# Patient Record
Sex: Male | Born: 1955 | Race: White | Hispanic: No | Marital: Married | Smoking: Former smoker
Health system: Southern US, Community
[De-identification: ages and names within clinical notes are randomized; demographics above are authoritative.]

## PROBLEM LIST (undated history)

## (undated) DIAGNOSIS — K449 Diaphragmatic hernia without obstruction or gangrene: Secondary | ICD-10-CM

## (undated) DIAGNOSIS — I10 Essential (primary) hypertension: Secondary | ICD-10-CM

## (undated) DIAGNOSIS — J40 Bronchitis, not specified as acute or chronic: Secondary | ICD-10-CM

## (undated) DIAGNOSIS — T7840XA Allergy, unspecified, initial encounter: Secondary | ICD-10-CM

## (undated) DIAGNOSIS — J329 Chronic sinusitis, unspecified: Secondary | ICD-10-CM

## (undated) DIAGNOSIS — K5792 Diverticulitis of intestine, part unspecified, without perforation or abscess without bleeding: Secondary | ICD-10-CM

## (undated) DIAGNOSIS — J45909 Unspecified asthma, uncomplicated: Secondary | ICD-10-CM

## (undated) DIAGNOSIS — F419 Anxiety disorder, unspecified: Secondary | ICD-10-CM

## (undated) DIAGNOSIS — K649 Unspecified hemorrhoids: Secondary | ICD-10-CM

## (undated) DIAGNOSIS — K219 Gastro-esophageal reflux disease without esophagitis: Secondary | ICD-10-CM

## (undated) HISTORY — DX: Unspecified asthma, uncomplicated: J45.909

## (undated) HISTORY — DX: Allergy, unspecified, initial encounter: T78.40XA

## (undated) HISTORY — PX: INGUINAL HERNIA REPAIR: SUR1180

## (undated) HISTORY — DX: Unspecified hemorrhoids: K64.9

## (undated) HISTORY — DX: Diaphragmatic hernia without obstruction or gangrene: K44.9

## (undated) HISTORY — DX: Chronic sinusitis, unspecified: J32.9

---

## 2003-06-10 ENCOUNTER — Ambulatory Visit (HOSPITAL_COMMUNITY): Admission: RE | Admit: 2003-06-10 | Discharge: 2003-06-10 | Payer: Self-pay | Admitting: Family Medicine

## 2007-10-10 ENCOUNTER — Emergency Department (HOSPITAL_COMMUNITY): Admission: EM | Admit: 2007-10-10 | Discharge: 2007-10-10 | Payer: Self-pay | Admitting: Emergency Medicine

## 2012-02-21 DIAGNOSIS — R0602 Shortness of breath: Secondary | ICD-10-CM

## 2015-08-01 ENCOUNTER — Emergency Department (HOSPITAL_COMMUNITY): Payer: Managed Care, Other (non HMO)

## 2015-08-01 ENCOUNTER — Encounter (HOSPITAL_COMMUNITY): Payer: Self-pay | Admitting: Emergency Medicine

## 2015-08-01 ENCOUNTER — Emergency Department (HOSPITAL_COMMUNITY)
Admission: EM | Admit: 2015-08-01 | Discharge: 2015-08-01 | Disposition: A | Discharge status: Home / Self Care | Payer: Managed Care, Other (non HMO) | Attending: Emergency Medicine | Admitting: Emergency Medicine

## 2015-08-01 DIAGNOSIS — Z8719 Personal history of other diseases of the digestive system: Secondary | ICD-10-CM | POA: Insufficient documentation

## 2015-08-01 DIAGNOSIS — F419 Anxiety disorder, unspecified: Secondary | ICD-10-CM | POA: Insufficient documentation

## 2015-08-01 DIAGNOSIS — R1010 Upper abdominal pain, unspecified: Secondary | ICD-10-CM | POA: Diagnosis present

## 2015-08-01 DIAGNOSIS — R1013 Epigastric pain: Secondary | ICD-10-CM | POA: Diagnosis not present

## 2015-08-01 DIAGNOSIS — Z87891 Personal history of nicotine dependence: Secondary | ICD-10-CM | POA: Diagnosis not present

## 2015-08-01 HISTORY — DX: Anxiety disorder, unspecified: F41.9

## 2015-08-01 HISTORY — DX: Gastro-esophageal reflux disease without esophagitis: K21.9

## 2015-08-01 LAB — COMPREHENSIVE METABOLIC PANEL
ALT: 36 U/L (ref 17–63)
ANION GAP: 14 (ref 5–15)
AST: 26 U/L (ref 15–41)
Albumin: 4.6 g/dL (ref 3.5–5.0)
Alkaline Phosphatase: 66 U/L (ref 38–126)
BILIRUBIN TOTAL: 1.2 mg/dL (ref 0.3–1.2)
BUN: 10 mg/dL (ref 6–20)
CO2: 27 mmol/L (ref 22–32)
Calcium: 11.4 mg/dL — ABNORMAL HIGH (ref 8.9–10.3)
Chloride: 101 mmol/L (ref 101–111)
Creatinine, Ser: 1.2 mg/dL (ref 0.61–1.24)
Glucose, Bld: 109 mg/dL — ABNORMAL HIGH (ref 65–99)
POTASSIUM: 4 mmol/L (ref 3.5–5.1)
Sodium: 142 mmol/L (ref 135–145)
TOTAL PROTEIN: 7.5 g/dL (ref 6.5–8.1)

## 2015-08-01 LAB — CBC
HEMATOCRIT: 47.2 % (ref 39.0–52.0)
Hemoglobin: 16.5 g/dL (ref 13.0–17.0)
MCH: 31.5 pg (ref 26.0–34.0)
MCHC: 35 g/dL (ref 30.0–36.0)
MCV: 90.2 fL (ref 78.0–100.0)
Platelets: 266 10*3/uL (ref 150–400)
RBC: 5.23 MIL/uL (ref 4.22–5.81)
RDW: 12.2 % (ref 11.5–15.5)
WBC: 7.4 10*3/uL (ref 4.0–10.5)

## 2015-08-01 LAB — D-DIMER, QUANTITATIVE (NOT AT ARMC)

## 2015-08-01 LAB — LIPASE, BLOOD: Lipase: 21 U/L (ref 11–51)

## 2015-08-01 LAB — I-STAT TROPONIN, ED: Troponin i, poc: 0 ng/mL (ref 0.00–0.08)

## 2015-08-01 MED ORDER — GI COCKTAIL ~~LOC~~
30.0000 mL | Freq: Once | ORAL | Status: AC
  Administered 2015-08-01: 30 mL via ORAL
  Filled 2015-08-01: qty 30

## 2015-08-01 MED ORDER — HYDROXYZINE HCL 25 MG PO TABS: 25.0000 mg | ORAL_TABLET | Freq: Four times a day (QID) | ORAL | Status: DC | PRN

## 2015-08-01 NOTE — ED Notes (Signed)
Pt sts abd pain and pressure in upper area x 2 days; pt sts some SOB with pain; pt sts some diaphoresis

## 2015-08-01 NOTE — Discharge Instructions (Signed)
Please continue taking your protonix 30 minutes before each meal but no more than twice daily to help with your discomfort.  Take hydroxyzine as needed for anxiety.  Follow up with your doctor for further management of your condition.  You may also benefit seeing a cardiologist for evaluation of your heart such as having a stress test if your personal doctor deem appropriate.  Return to ER if you have any concerns.   Gastroesophageal Reflux Disease, Adult Normally, food travels down the esophagus and stays in the stomach to be digested. However, when a person has gastroesophageal reflux disease (GERD), food and stomach acid move back up into the esophagus. When this happens, the esophagus becomes sore and inflamed. Over time, GERD can create small holes (ulcers) in the lining of the esophagus.  CAUSES This condition is caused by a problem with the muscle between the esophagus and the stomach (lower esophageal sphincter, or LES). Normally, the LES muscle closes after food passes through the esophagus to the stomach. When the LES is weakened or abnormal, it does not close properly, and that allows food and stomach acid to go back up into the esophagus. The LES can be weakened by certain dietary substances, medicines, and medical conditions, including:  Tobacco use.  Pregnancy.  Having a hiatal hernia.  Heavy alcohol use.  Certain foods and beverages, such as coffee, chocolate, onions, and peppermint. RISK FACTORS This condition is more likely to develop in:  People who have an increased body weight.  People who have connective tissue disorders.  People who use NSAID medicines. SYMPTOMS Symptoms of this condition include:  Heartburn.  Difficult or painful swallowing.  The feeling of having a lump in the throat.  Abitter taste in the mouth.  Bad breath.  Having a large amount of saliva.  Having an upset or bloated stomach.  Belching.  Chest pain.  Shortness of breath or  wheezing.  Ongoing (chronic) cough or a night-time cough.  Wearing away of tooth enamel.  Weight loss. Different conditions can cause chest pain. Make sure to see your health care provider if you experience chest pain. DIAGNOSIS Your health care provider will take a medical history and perform a physical exam. To determine if you have mild or severe GERD, your health care provider may also monitor how you respond to treatment. You may also have other tests, including:  An endoscopy toexamine your stomach and esophagus with a small camera.  A test thatmeasures the acidity level in your esophagus.  A test thatmeasures how much pressure is on your esophagus.  A barium swallow or modified barium swallow to show the shape, size, and functioning of your esophagus. TREATMENT The goal of treatment is to help relieve your symptoms and to prevent complications. Treatment for this condition may vary depending on how severe your symptoms are. Your health care provider may recommend:  Changes to your diet.  Medicine.  Surgery. HOME CARE INSTRUCTIONS Diet  Follow a diet as recommended by your health care provider. This may involve avoiding foods and drinks such as:  Coffee and tea (with or without caffeine).  Drinks that containalcohol.  Energy drinks and sports drinks.  Carbonated drinks or sodas.  Chocolate and cocoa.  Peppermint and mint flavorings.  Garlic and onions.  Horseradish.  Spicy and acidic foods, including peppers, chili powder, curry powder, vinegar, hot sauces, and barbecue sauce.  Citrus fruit juices and citrus fruits, such as oranges, lemons, and limes.  Tomato-based foods, such as red sauce,  chili, salsa, and pizza with red sauce.  Fried and fatty foods, such as donuts, french fries, potato chips, and high-fat dressings.  High-fat meats, such as hot dogs and fatty cuts of red and white meats, such as rib eye steak, sausage, ham, and bacon.  High-fat  dairy items, such as whole milk, butter, and cream cheese.  Eat small, frequent meals instead of large meals.  Avoid drinking large amounts of liquid with your meals.  Avoid eating meals during the 2-3 hours before bedtime.  Avoid lying down right after you eat.  Do not exercise right after you eat. General Instructions  Pay attention to any changes in your symptoms.  Take over-the-counter and prescription medicines only as told by your health care provider. Do not take aspirin, ibuprofen, or other NSAIDs unless your health care provider told you to do so.  Do not use any tobacco products, including cigarettes, chewing tobacco, and e-cigarettes. If you need help quitting, ask your health care provider.  Wear loose-fitting clothing. Do not wear anything tight around your waist that causes pressure on your abdomen.  Raise (elevate) the head of your bed 6 inches (15cm).  Try to reduce your stress, such as with yoga or meditation. If you need help reducing stress, ask your health care provider.  If you are overweight, reduce your weight to an amount that is healthy for you. Ask your health care provider for guidance about a safe weight loss goal.  Keep all follow-up visits as told by your health care provider. This is important. SEEK MEDICAL CARE IF:  You have new symptoms.  You have unexplained weight loss.  You have difficulty swallowing, or it hurts to swallow.  You have wheezing or a persistent cough.  Your symptoms do not improve with treatment.  You have a hoarse voice. SEEK IMMEDIATE MEDICAL CARE IF:  You have pain in your arms, neck, jaw, teeth, or back.  You feel sweaty, dizzy, or light-headed.  You have chest pain or shortness of breath.  You vomit and your vomit looks like blood or coffee grounds.  You faint.  Your stool is bloody or black.  You cannot swallow, drink, or eat.   This information is not intended to replace advice given to you by your  health care provider. Make sure you discuss any questions you have with your health care provider.   Document Released: 02/24/2005 Document Revised: 02/05/2015 Document Reviewed: 09/11/2014 Elsevier Interactive Patient Education [employer].

## 2015-08-01 NOTE — ED Provider Notes (Signed)
CSN: [id number]     Arrival date & time 08/01/15  0707 History   First MD Initiated Contact with Patient 08/01/15 [phone]     Chief Complaint  Patient presents with  . Abdominal Pain     (Consider location/radiation/quality/duration/timing/severity/associated sxs/prior Treatment) HPI   60 year old male with history of GERD and anxiety who presents for evaluation of upper abdominal pain. Patient reports yesterday afternoon he developed gradual onset of upper abdominal pain which she described as a gnawing sensation, persistent, with associate lightheadedness, dizziness, shortness of breath and feeling anxious. He mentioned that the symptoms felt similar to his usual GERD but a little bit more intense. He also admits to having history of anxiety and this has causes anxiety 2 intensified. He woke up this morning with a sense sensation decided to come to ER for further evaluation. At this time he reports this discomfort is 5 out of 10 without any specific treatment. He usually takes Protonix daily for his GERD. He is a former smoker and has quit for more than a year. He denies any prior history of cardiac disease. Denies any prior history of PE or DVT, no recent surgery, prolonged bed rest, unilateral leg swelling, calf pain, active cancer, or hemoptysis. 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 Furthermore he denies any URI symptoms. His primary concern is actually his anxiety and states that he was on anti-anxiety medication but weaning himself off more than a year ago. He denies any history of alcohol abuse, no history of diabetes, or hypertension.  Past Medical History  Diagnosis Date  . GERD (gastroesophageal reflux disease)   . Anxiety    History reviewed. No pertinent past surgical history. History reviewed. No pertinent family history. Social History  Substance Use Topics  . Smoking status: Former Research scientist (life sciences)  . Smokeless tobacco: None  .  Alcohol Use: Yes     Comment: occ    Review of Systems  All other systems reviewed and are negative.     Allergies  Review of patient's allergies indicates no known allergies.  Home Medications   Prior to Admission medications   Not on File   BP 159/100 mmHg  Pulse 74  Temp(Src) 97.6 F (36.4 C) (Oral)  Resp 20  SpO2 100% Physical Exam  Constitutional: He appears well-developed and well-nourished. No distress.  Caucasian male pacing around the room but in no acute discomfort.  HENT:  Head: Atraumatic.  Eyes: Conjunctivae are normal.  Neck: Neck supple.  Cardiovascular: Normal rate and regular rhythm.   Pulmonary/Chest: Effort normal and breath sounds normal.  Abdominal: Soft. There is tenderness (Mild epigastric tenderness without guarding or rebound tenderness. Negative Murphy sign, no pain at McBurney's point.).  Musculoskeletal: He exhibits no edema.  Neurological: He is alert.  Skin: No rash noted.  Psychiatric: He has a normal mood and affect.  Nursing note and vitals reviewed.   ED Course  Procedures (including critical care time) Labs Review Labs Reviewed  COMPREHENSIVE METABOLIC PANEL - Abnormal; Notable for the following:    Glucose, Bld 109 (*)    Calcium 11.4 (*)    All other components within normal limits  LIPASE, BLOOD  CBC  D-DIMER, QUANTITATIVE (NOT AT [facility])  Randolm Idol, ED    Imaging Review Dg Chest 2 View  08/01/2015  CLINICAL DATA:  Epigastric pain, shortness of breath and cough. History of bronchitis, gastroesophageal reflux, discontinued smoking 6 months ago. EXAM: CHEST  2 VIEW COMPARISON:  Portable chest x-ray of September 15, 2014 FINDINGS: The lungs are adequately inflated. There is no focal infiltrate. The interstitial markings are coarse bilaterally. The heart and pulmonary vascularity are normal. The mediastinum is normal in width. There is no pleural effusion. The bony thorax exhibits no acute abnormality. IMPRESSION: Chronic  bronchitic and smoking related changes. There is no evidence of pneumonia, CHF, nor other acute cardiopulmonary disease. Electronically Signed   By: David  Martinique M.D.   On: 08/01/2015 08:49   I have personally reviewed and evaluated these images and lab results as part of my medical decision-making.   EKG Interpretation   Date/Time:  Friday August 01 2015 07:23:33 EST Ventricular Rate:  79 PR Interval:  160 QRS Duration: 106 QT Interval:  372 QTC Calculation: 426 R Axis:   -74 Text Interpretation:  Normal sinus rhythm Left axis deviation Pulmonary  disease pattern Incomplete right bundle branch block Abnormal ECG  Confirmed by Hazle Coca [phone]) on 08/01/2015 7:49:02 AM      MDM   Final diagnoses:  Epigastric abdominal pain  Anxiety  History of gastroesophageal reflux (GERD)    BP 150/97 mmHg  Pulse 73  Temp(Src) 97.6 F (36.4 C) (Oral)  Resp 16  SpO2 98%   8:19 AM Patient presents with upper abdominal pain likely related to GERD a possible hiatal hernia given his presentation. He is at low risk for ACS however given his age, a cardiac workup was obtained. I have low suspicion for biliary disease or pancreatitis. Although patient is low risk for PE, he expressed that his grandfather died from a blood clot in the past and was concerned. He did report having shortness of breath and due to his age, I cannot use PERC criteria to rule him out, therefore a d-dimer was obtained.  9:14 AM EKGs is without acute ischemic changes. Chest x-ray demonstrated chronic bronchitic changes but no signs of pneumonia, CHF or other acute cardiopulmonary disease. Has normal lipase and normal electrolytes panel with normal H&H.  9:57 AM Patient report moderate improvement after receiving GI cocktail. His d-dimer was negative therefore no suspicion for pulmonary embolism. I recommend patient to follow-up with his primary care provider for further evaluation and recommend outpatient cardiac evaluation  if deemed appropriate by his PCP. Return precautions discussed. Care discussed with Dr. Ralene Bathe.  Domenic Moras, PA-C 08/01/15 [place], MD 08/01/15 1041

## 2016-08-05 ENCOUNTER — Encounter (HOSPITAL_COMMUNITY): Payer: Self-pay | Admitting: Cardiology

## 2016-08-05 ENCOUNTER — Emergency Department (HOSPITAL_COMMUNITY)
Admission: EM | Admit: 2016-08-05 | Discharge: 2016-08-05 | Disposition: A | Discharge status: Home / Self Care | Payer: Managed Care, Other (non HMO) | Attending: Emergency Medicine | Admitting: Emergency Medicine

## 2016-08-05 ENCOUNTER — Emergency Department (HOSPITAL_COMMUNITY): Payer: Managed Care, Other (non HMO)

## 2016-08-05 DIAGNOSIS — I160 Hypertensive urgency: Secondary | ICD-10-CM | POA: Diagnosis not present

## 2016-08-05 DIAGNOSIS — R0789 Other chest pain: Secondary | ICD-10-CM | POA: Diagnosis present

## 2016-08-05 DIAGNOSIS — M79671 Pain in right foot: Secondary | ICD-10-CM | POA: Insufficient documentation

## 2016-08-05 DIAGNOSIS — Z87891 Personal history of nicotine dependence: Secondary | ICD-10-CM | POA: Insufficient documentation

## 2016-08-05 DIAGNOSIS — Z79899 Other long term (current) drug therapy: Secondary | ICD-10-CM | POA: Diagnosis not present

## 2016-08-05 LAB — BASIC METABOLIC PANEL
Anion gap: 10 (ref 5–15)
BUN: 13 mg/dL (ref 6–20)
CHLORIDE: 99 mmol/L — AB (ref 101–111)
CO2: 27 mmol/L (ref 22–32)
CREATININE: 1.15 mg/dL (ref 0.61–1.24)
Calcium: 10.1 mg/dL (ref 8.9–10.3)
GFR calc Af Amer: >60 mL/min (ref >60)
GFR calc non Af Amer: >60 mL/min (ref >60)
GLUCOSE: 115 mg/dL — AB (ref 65–99)
POTASSIUM: 4 mmol/L (ref 3.5–5.1)
SODIUM: 136 mmol/L (ref 135–145)

## 2016-08-05 LAB — URINALYSIS, ROUTINE W REFLEX MICROSCOPIC
BILIRUBIN URINE: NEGATIVE
GLUCOSE, UA: NEGATIVE mg/dL
HGB URINE DIPSTICK: NEGATIVE
KETONES UR: NEGATIVE mg/dL
Leukocytes, UA: NEGATIVE
Nitrite: NEGATIVE
PROTEIN: NEGATIVE mg/dL
Specific Gravity, Urine: 1.001 — ABNORMAL LOW (ref 1.005–1.030)
pH: 8 (ref 5.0–8.0)

## 2016-08-05 LAB — CBC WITH DIFFERENTIAL/PLATELET
Basophils Absolute: 0 10*3/uL (ref 0.0–0.1)
Basophils Relative: 0 %
EOS ABS: 0.1 10*3/uL (ref 0.0–0.7)
EOS PCT: 1 %
HCT: 46.5 % (ref 39.0–52.0)
Hemoglobin: 16.1 g/dL (ref 13.0–17.0)
LYMPHS ABS: 1.6 10*3/uL (ref 0.7–4.0)
LYMPHS PCT: 16 %
MCH: 31.9 pg (ref 26.0–34.0)
MCHC: 34.6 g/dL (ref 30.0–36.0)
MCV: 92.1 fL (ref 78.0–100.0)
MONO ABS: 0.5 10*3/uL (ref 0.1–1.0)
MONOS PCT: 5 %
Neutro Abs: 8.2 10*3/uL — ABNORMAL HIGH (ref 1.7–7.7)
Neutrophils Relative %: 78 %
PLATELETS: 295 10*3/uL (ref 150–400)
RBC: 5.05 MIL/uL (ref 4.22–5.81)
RDW: 12.4 % (ref 11.5–15.5)
WBC: 10.5 10*3/uL (ref 4.0–10.5)

## 2016-08-05 MED ORDER — PREDNISONE 10 MG PO TABS
60.0000 mg | ORAL_TABLET | ORAL | Status: AC
  Administered 2016-08-05: 60 mg via ORAL
  Filled 2016-08-05: qty 1

## 2016-08-05 MED ORDER — PREDNISONE 20 MG PO TABS: 40.0000 mg | ORAL_TABLET | Freq: Every day | ORAL | 0 refills | Status: DC

## 2016-08-05 MED ORDER — GI COCKTAIL ~~LOC~~
30.0000 mL | Freq: Once | ORAL | Status: AC
  Administered 2016-08-05: 30 mL via ORAL
  Filled 2016-08-05: qty 30

## 2016-08-05 NOTE — ED Provider Notes (Signed)
[facility] DEPT Provider Note   CSN: [id number] Arrival date & time: 08/05/16  1053   By signing my name below, I, Hilbert Odor, attest that this documentation has been prepared under the direction and in the presence of Carmin Muskrat, MD. Electronically Signed: Hilbert Odor, Scribe. 08/05/16. 12:54 PM. History   Chief Complaint Chief Complaint  Patient presents with  . Chest Pain    The history is provided by the patient. No language interpreter was used.  HPI Comments: Justin English is a 61 y.o. male who presents to the Emergency Department complaining of chest discomfort that began earlier today. The patient states that he was at work earlier today and began to feel some chest discomfort. The company nurse checked his BP and it was noted to be 180/110. He was advised by the nurse to come to the ED for an evaluation. The patient states that he has had chest discomfort in the past and he believes it is due to indigestion. He states that he drank a lot of coffee this morning. The patient also reports right foot pain. The patient believes that he may have gout. He denies ever being diagnosed with gout.  Past Medical History:  Diagnosis Date  . Anxiety   . GERD (gastroesophageal reflux disease)     There are no active problems to display for this patient.   History reviewed. No pertinent surgical history.     Home Medications    Prior to Admission medications   Medication Sig Start Date End Date Taking? Authorizing Provider  diphenhydrAMINE (SOMINEX) 25 MG tablet Take 25 mg by mouth at bedtime as needed for allergies or sleep.   Yes Historical Provider, MD  ibuprofen (ADVIL,MOTRIN) 200 MG tablet Take 200 mg by mouth every 6 (six) hours as needed for moderate pain.   Yes Historical Provider, MD  loratadine (CLARITIN) 10 MG tablet Take 10 mg by mouth daily. 07/08/16  Yes Historical Provider, MD  magnesium gluconate (MAGONATE) 500 MG tablet Take 500 mg by mouth daily.    Yes Historical Provider, MD  pantoprazole (PROTONIX) 40 MG tablet Take 40 mg by mouth daily. 07/19/15  Yes Historical Provider, MD  PROAIR HFA 108 (90 Base) MCG/ACT inhaler Inhale 2 puffs into the lungs every 4 (four) hours as needed. 05/27/16  Yes Historical Provider, MD  QVAR 80 MCG/ACT inhaler Inhale 2 puffs into the lungs 2 (two) times daily.  06/27/16  Yes Historical Provider, MD  hydrOXYzine (ATARAX/VISTARIL) 25 MG tablet Take 1 tablet (25 mg total) by mouth every 6 (six) hours as needed for anxiety. Patient not taking: Reported on 08/05/2016 08/01/15   Domenic Moras, PA-C  pseudoephedrine (SUDAFED) 30 MG tablet Take 30 mg by mouth every 4 (four) hours as needed for congestion.    Historical Provider, MD    Family History History reviewed. No pertinent family history.  Social History Social History  Substance Use Topics  . Smoking status: Former Research scientist (life sciences)  . Smokeless tobacco: Not on file  . Alcohol use Yes     Comment: occ     Allergies   Patient has no known allergies.   Review of Systems Review of Systems  Constitutional: Negative for chills and fever.  Eyes: Negative for visual disturbance.  Respiratory: Negative for cough.   Cardiovascular: Positive for chest pain.  Genitourinary: Negative for dysuria.  Musculoskeletal: Positive for myalgias (right foot pain).  Skin: Negative for color change.  Allergic/Immunologic: Negative for immunocompromised state.  Neurological: Negative for headaches.  Hematological: Negative.   All other systems reviewed and are negative.    Physical Exam Updated Vital Signs BP (!) 155/103   Pulse 101   Temp 98.2 F (36.8 C) (Oral)   Resp 18   Ht 6' 2.5" (1.892 m)   Wt 220 lb (99.8 kg)   SpO2 100%   BMI 27.87 kg/m   Physical Exam  Constitutional: He is oriented to person, place, and time. He appears well-developed and well-nourished.  HENT:  Head: Normocephalic and atraumatic.  Eyes: EOM are normal.  Neck: Normal range of motion.    Cardiovascular: Normal rate, regular rhythm, normal heart sounds and intact distal pulses.   Pulmonary/Chest: Effort normal and breath sounds normal. No respiratory distress.  Abdominal: Soft. He exhibits no distension. There is no tenderness.  Musculoskeletal: Normal range of motion.       Right ankle: Normal.       Feet:  Neurological: He is alert and oriented to person, place, and time.  Skin: Skin is warm and dry.  Psychiatric: He has a normal mood and affect. Judgment normal.  Nursing note and vitals reviewed.    ED Treatments / Results  DIAGNOSTIC STUDIES: Oxygen Saturation is 100% on RA, normal by my interpretation.    COORDINATION OF CARE: 12:16 PM Discussed treatment plan with pt at bedside and pt agreed to plan. I will check the patient's X-ray and labs.  Labs (all labs ordered are listed, but only abnormal results are displayed) Labs Reviewed  BASIC METABOLIC PANEL  CBC WITH DIFFERENTIAL/PLATELET  URINALYSIS, ROUTINE W REFLEX MICROSCOPIC    EKG  EKG Interpretation  Date/Time:  Thursday August 05 2016 11:12:39 EST Ventricular Rate:  94 PR Interval:    QRS Duration: 108 QT Interval:  344 QTC Calculation: 431 R Axis:   -50 Text Interpretation:  Sinus rhythm Incomplete RBBB and LAFB Abnormal R-wave progression, late transition Artifact Abnormal ekg Confirmed by Carmin Muskrat  MD [id number]) on 08/05/2016 12:04:40 PM       Radiology Dg Chest 2 View  Result Date: 08/05/2016 CLINICAL DATA:  Chest pain, hypertension EXAM: CHEST  2 VIEW COMPARISON:  08/01/2015 FINDINGS: Cardiomediastinal silhouette is stable. No infiltrate or pleural effusion. No pulmonary edema. Mild degenerative changes mid thoracic spine. IMPRESSION: No active cardiopulmonary disease. Electronically Signed   By: Lahoma Crocker M.D.   On: 08/05/2016 13:05    Procedures Procedures (including critical care time)  Medications Ordered in ED Medications  predniSONE (DELTASONE) tablet 60 mg (60 mg Oral  Given 08/05/16 1241)  gi cocktail (Maalox,Lidocaine,Donnatal) (30 mLs Oral Given 08/05/16 1242)     Initial Impression / Assessment and Plan / ED Course  I have reviewed the triage vital signs and the nursing notes.  Pertinent labs & imaging results that were available during my care of the patient were reviewed by me and considered in my medical decision making (see chart for details).  3:01 PM Patient awake, alert, in no distress. We discussed all findings at length With reassuring labs, no evidence for renal, cardiac involvement, dysfunction given the patient's hypertension. Patient will follow up with his primary care physician for consideration of new medication regimen. No physical exam evidence for gout, septic arthritis. Patient likely has inflammatory condition given his endorsement of using new footwear. Patient counseled on appropriate home care, will start a short course of steroids, follow up with podiatry as needed.  Final Clinical Impressions(s) / ED Diagnoses   Final diagnoses:  Hypertensive urgency  Acute pain of  right foot    New Prescriptions New Prescriptions   PREDNISONE (DELTASONE) 20 MG TABLET    Take 2 tablets (40 mg total) by mouth daily with breakfast. For the next four days    I personally performed the services described in this documentation, which was scribed in my presence. The recorded information has been reviewed and is accurate.       Carmin Muskrat, MD 08/05/16 [phone]

## 2016-08-05 NOTE — ED Triage Notes (Signed)
Sent here from [place] for chest discomfort  And sob.  B/p  180/110.  Pt also c/o on and off feet burning times one week.

## 2016-08-05 NOTE — Discharge Instructions (Signed)
As discussed, your evaluation today has been largely reassuring.  But, it is important that you monitor your condition carefully, and do not hesitate to return to the ED if you develop new, or concerning changes in your condition. ? ?Otherwise, please follow-up with your physician for appropriate ongoing care. ? ?

## 2016-08-05 NOTE — ED Notes (Signed)
Pt made aware to return if symptoms worsen or if any life threatening symptoms occur.   

## 2017-09-01 ENCOUNTER — Ambulatory Visit (HOSPITAL_COMMUNITY)
Admission: EM | Admit: 2017-09-01 | Discharge: 2017-09-01 | Disposition: A | Discharge status: Home / Self Care | Payer: PRIVATE HEALTH INSURANCE | Attending: Family Medicine | Admitting: Family Medicine

## 2017-09-01 ENCOUNTER — Encounter (HOSPITAL_COMMUNITY): Payer: Self-pay | Admitting: Emergency Medicine

## 2017-09-01 DIAGNOSIS — K649 Unspecified hemorrhoids: Secondary | ICD-10-CM | POA: Diagnosis not present

## 2017-09-01 MED ORDER — DOCUSATE SODIUM 250 MG PO CAPS: 250.0000 mg | ORAL_CAPSULE | Freq: Every day | ORAL | 0 refills | Status: DC

## 2017-09-01 MED ORDER — HYDROCORTISONE 2.5 % RE CREA: TOPICAL_CREAM | RECTAL | 0 refills | Status: DC

## 2017-09-01 NOTE — ED Triage Notes (Signed)
Pt c/o hemorrhoid pains.

## 2017-09-01 NOTE — Discharge Instructions (Signed)
Increase water in your diet as well as fiber. Daily stool softener.  Use of prescribed cream twice a day. Sitz bath for comfort. May follow up with GI if worsening or no improvement.

## 2017-09-01 NOTE — ED Provider Notes (Signed)
[place]    CSN: [id number] Arrival date & time: 09/01/17  1905     History   Chief Complaint Chief Complaint  Patient presents with  . Hemorrhoids    HPI Justin English is a 62 y.o. male.   [name] presents with complaints of hemorrhoids which have flared over the past few days. States he does a lot of lifting at his job. States his stools have been more hard and has had to strain today. States tends to have a bm daily typically. Without bleeding. Has had in the past but not in years. Has intermittently tried preparation h but not regularly. Hx of gerd and anxiety.     ROS per HPI.      Past Medical History:  Diagnosis Date  . Anxiety   . GERD (gastroesophageal reflux disease)     There are no active problems to display for this patient.   History reviewed. No pertinent surgical history.     Home Medications    Prior to Admission medications   Medication Sig Start Date End Date Taking? Authorizing Provider  diphenhydrAMINE (SOMINEX) 25 MG tablet Take 25 mg by mouth at bedtime as needed for allergies or sleep.    [provider]  docusate sodium (COLACE) 250 MG capsule Take 1 capsule (250 mg total) by mouth daily. 09/01/17   Zigmund Gottron, NP  hydrocortisone (ANUSOL-HC) 2.5 % rectal cream Apply rectally 2 times daily 09/01/17   Augusto Gamble B, NP  hydrOXYzine (ATARAX/VISTARIL) 25 MG tablet Take 1 tablet (25 mg total) by mouth every 6 (six) hours as needed for anxiety. Patient not taking: Reported on 08/05/2016 08/01/15   Domenic Moras, PA-C  ibuprofen (ADVIL,MOTRIN) 200 MG tablet Take 200 mg by mouth every 6 (six) hours as needed for moderate pain.    [provider]  loratadine (CLARITIN) 10 MG tablet Take 10 mg by mouth daily. 07/08/16   [provider]  magnesium gluconate (MAGONATE) 500 MG tablet Take 500 mg by mouth daily.    [provider]  pantoprazole (PROTONIX) 40 MG tablet Take 40 mg by mouth daily. 07/19/15    [provider]  predniSONE (DELTASONE) 20 MG tablet Take 2 tablets (40 mg total) by mouth daily with breakfast. For the next four days 08/05/16   Carmin Muskrat, MD  [facility] HFA 108 [phone] Base) MCG/ACT inhaler Inhale 2 puffs into the lungs every 4 (four) hours as needed. 05/27/16   [provider]  pseudoephedrine (SUDAFED) 30 MG tablet Take 30 mg by mouth every 4 (four) hours as needed for congestion.    [provider]  QVAR 80 MCG/ACT inhaler Inhale 2 puffs into the lungs 2 (two) times daily.  06/27/16   [provider]    Family History No family history on file.  Social History Social History   Tobacco Use  . Smoking status: Former Smoker  Substance Use Topics  . Alcohol use: Yes    Comment: occ  . Drug use: No     Allergies   Patient has no known allergies.   Review of Systems Review of Systems   Physical Exam Triage Vital Signs ED Triage Vitals [09/01/17 1919]  Enc Vitals Group     BP (!) 141/100     Pulse Rate (!) 110     Resp 18     Temp 98.5 F (36.9 C)     Temp src      SpO2 100 %  Weight      Height      Head Circumference      Peak Flow      Pain Score      Pain Loc      Pain Edu?      Excl. in [place]?    No data found.  Updated Vital Signs BP (!) 141/100   Pulse (!) 110   Temp 98.5 F (36.9 C)   Resp 18   SpO2 100%   Visual Acuity Right Eye Distance:   Left Eye Distance:   Bilateral Distance:    Right Eye Near:   Left Eye Near:    Bilateral Near:     Physical Exam  Constitutional: He is oriented to person, place, and time. He appears well-developed and well-nourished.  Cardiovascular: Normal rate and regular rhythm.  Pulmonary/Chest: Effort normal and breath sounds normal.  Genitourinary: Rectal exam shows external hemorrhoid.  Genitourinary Comments: Approximately 0.5 cm external hemorrhoid noted without obvious thrombus; without active bleeding; internal exam deferred due to discomfort    Neurological: He is alert and oriented to person, place, and time.  Skin: Skin is warm and dry.     UC Treatments / Results  Labs (all labs ordered are listed, but only abnormal results are displayed) Labs Reviewed - No data to display  EKG None Radiology No results found.  Procedures Procedures (including critical care time)  Medications Ordered in UC Medications - No data to display   Initial Impression / Assessment and Plan / UC Course  I have reviewed the triage vital signs and the nursing notes.  Pertinent labs & imaging results that were available during my care of the patient were reviewed by me and considered in my medical decision making (see chart for details).     anusol cream, colace, sitz baths recommended. Follow up with gi as needed. Discussed prevention with increased fiber and fluid in diet. Patient verbalized understanding and agreeable to plan.    Final Clinical Impressions(s) / UC Diagnoses   Final diagnoses:  Hemorrhoids, unspecified hemorrhoid type    ED Discharge Orders        Ordered    hydrocortisone (ANUSOL-HC) 2.5 % rectal cream     09/01/17 1949    docusate sodium (COLACE) 250 MG capsule  Daily     09/01/17 1949       Controlled Substance Prescriptions [place] Controlled Substance Registry consulted? Not Applicable   Zigmund Gottron, NP 09/01/17 [phone]

## 2018-01-12 ENCOUNTER — Encounter: Payer: Self-pay | Admitting: Gastroenterology

## 2018-02-07 ENCOUNTER — Ambulatory Visit: Payer: PRIVATE HEALTH INSURANCE | Admitting: Gastroenterology

## 2018-03-06 ENCOUNTER — Ambulatory Visit: Payer: PRIVATE HEALTH INSURANCE | Admitting: Gastroenterology

## 2018-03-16 ENCOUNTER — Encounter: Payer: Self-pay | Admitting: Gastroenterology

## 2018-03-16 ENCOUNTER — Ambulatory Visit (INDEPENDENT_AMBULATORY_CARE_PROVIDER_SITE_OTHER): Payer: 59 | Admitting: Gastroenterology

## 2018-03-16 ENCOUNTER — Other Ambulatory Visit (INDEPENDENT_AMBULATORY_CARE_PROVIDER_SITE_OTHER): Payer: 59

## 2018-03-16 ENCOUNTER — Encounter

## 2018-03-16 VITALS — BP 118/78 | HR 84 | Ht 72.0 in | Wt 203.5 lb

## 2018-03-16 DIAGNOSIS — R0789 Other chest pain: Secondary | ICD-10-CM

## 2018-03-16 DIAGNOSIS — Z1211 Encounter for screening for malignant neoplasm of colon: Secondary | ICD-10-CM | POA: Diagnosis not present

## 2018-03-16 DIAGNOSIS — R1319 Other dysphagia: Secondary | ICD-10-CM

## 2018-03-16 DIAGNOSIS — K219 Gastro-esophageal reflux disease without esophagitis: Secondary | ICD-10-CM

## 2018-03-16 DIAGNOSIS — R1310 Dysphagia, unspecified: Secondary | ICD-10-CM | POA: Diagnosis not present

## 2018-03-16 LAB — HEPATIC FUNCTION PANEL
ALT: 22 U/L (ref 0–53)
AST: 15 U/L (ref 0–37)
Albumin: 4.8 g/dL (ref 3.5–5.2)
Alkaline Phosphatase: 70 U/L (ref 39–117)
BILIRUBIN DIRECT: 0.2 mg/dL (ref 0.0–0.3)
BILIRUBIN TOTAL: 0.8 mg/dL (ref 0.2–1.2)
Total Protein: 7.9 g/dL (ref 6.0–8.3)

## 2018-03-16 LAB — IBC PANEL
Iron: 73 ug/dL (ref 42–165)
Saturation Ratios: 20.8 % (ref 20.0–50.0)
Transferrin: 251 mg/dL (ref 212.0–360.0)

## 2018-03-16 LAB — COMPREHENSIVE METABOLIC PANEL
ALT: 22 U/L (ref 0–53)
AST: 15 U/L (ref 0–37)
Albumin: 4.8 g/dL (ref 3.5–5.2)
Alkaline Phosphatase: 70 U/L (ref 39–117)
BUN: 16 mg/dL (ref 6–23)
CHLORIDE: 100 meq/L (ref 96–112)
CO2: 30 meq/L (ref 19–32)
Calcium: 10.2 mg/dL (ref 8.4–10.5)
Creatinine, Ser: 1.33 mg/dL (ref 0.40–1.50)
GFR: 58.29 mL/min — AB (ref >60.00)
GLUCOSE: 100 mg/dL — AB (ref 70–99)
POTASSIUM: 4.2 meq/L (ref 3.5–5.1)
Sodium: 139 mEq/L (ref 135–145)
Total Bilirubin: 0.8 mg/dL (ref 0.2–1.2)
Total Protein: 7.9 g/dL (ref 6.0–8.3)

## 2018-03-16 LAB — CBC
HEMATOCRIT: 46.4 % (ref 39.0–52.0)
HEMOGLOBIN: 15.7 g/dL (ref 13.0–17.0)
MCHC: 33.9 g/dL (ref 30.0–36.0)
MCV: 91.4 fl (ref 78.0–100.0)
PLATELETS: 299 10*3/uL (ref 150.0–400.0)
RBC: 5.08 Mil/uL (ref 4.22–5.81)
RDW: 12.8 % (ref 11.5–15.5)
WBC: 8.7 10*3/uL (ref 4.0–10.5)

## 2018-03-16 LAB — HIGH SENSITIVITY CRP: CRP, High Sensitivity: 3.54 mg/L (ref 0.000–5.000)

## 2018-03-16 LAB — TSH: TSH: 2.24 u[IU]/mL (ref 0.35–4.50)

## 2018-03-16 MED ORDER — PANTOPRAZOLE SODIUM 40 MG PO TBEC: 40.0000 mg | DELAYED_RELEASE_TABLET | Freq: Two times a day (BID) | ORAL | 3 refills | Status: DC

## 2018-03-16 NOTE — Progress Notes (Signed)
[place] VISIT   Primary Care Provider Everardo Beals, NP [facility] [address] [place] [address] [phone]  Referring Provider No referring provider defined for this encounter.   Patient Profile: Justin English is a 62 y.o. male with a pmh significant for Asthma, Anxiety, GERD.  The patient presents to the [facility] for an evaluation and management of problem(s) noted below:  Problem List 1. Gastroesophageal reflux disease, esophagitis presence not specified   2. Esophageal dysphagia   3. Chest pain, atypical   4. Colon cancer screening     History of Present Illness: This is a patient first visit to the [place] clinic.  He describes a long history of having gastroesophageal reflux disease and being followed by an outside gastroenterologist in [place].he describes having had an upper endoscopy approximately 20 to 25 years ago.  He has had a prior colonoscopy about 10 to 15 years ago for colon cancer screening but thinks probably closer to 10 years since he got it around the age of 71.  The patient describes previously being on Nexium with good control of his heartburn/reflux.  However due to insurance issues he was transitioned to Protonix and has continued taking that.  Patient feels solid food dysphagia occurring a few times per week.  This mostly occurs when he is eating Greasy Foods or Fatty Foods.  He Has a Burning Sensation radiates from his stomach region up to his midportion of his chest.  He continues to take his Protonix but has had to take Mylanta to help with this.  He is never been on twice daily dosing of PPI.  He does not remember being told he had a hiatal hernia years ago.  The acid reflux changes do cause him to have some chest discomfort in the midepigastrium.  He says he is evaluated by his primary care doctor and is not felt that he has underlying cardiac etiology for his pain.  He is  very active at his work and moves large weights during the course of the day but does not develop chest pain.  He does develop acid reflux after a long day of holding or moving very heavy products.  He does take nonsteroidals on an infrequent basis.  He has had a weight loss of approximately 10 to 15 pounds over the course of the year to year and a half however there has been intention to doing this.  GI Review of Systems Positive as above Negative for odynophagia, nausea, vomiting, bloating, change in bowel habits, melena, hematochezia  Review of Systems General: Denies fevers/chills/weight loss HEENT: Denies oral lesions Cardiovascular: Denies chest pain Pulmonary: Denies shortness of breath Gastroenterological: See HPI Genitourinary: Denies darkened urine Hematological: Denies easy bruising/bleeding Dermatological: Denies new skin changes Psychological: Mood is stable Musculoskeletal: Denies new arthralgias   Medications Current Outpatient Medications  Medication Sig Dispense Refill  . busPIRone (BUSPAR) 15 MG tablet Take 15 mg by mouth as needed.    Marland Kitchen ibuprofen (ADVIL,MOTRIN) 200 MG tablet Take 200 mg by mouth as needed for moderate pain.     Marland Kitchen loratadine (CLARITIN) 10 MG tablet Take 10 mg by mouth daily.  6  . montelukast (SINGULAIR) 10 MG tablet Take 10 mg by mouth as needed.    . pantoprazole (PROTONIX) 40 MG tablet Take 40 mg by mouth daily.  3  . QVAR 80 MCG/ACT inhaler Inhale 2 puffs into the lungs 2 (two) times daily.     Marland Kitchen  Simethicone (MYLANTA GAS PO) Take 1 Dose by mouth as needed.    . pantoprazole (PROTONIX) 40 MG tablet Take 1 tablet (40 mg total) by mouth 2 (two) times daily. 60 tablet 3   No current facility-administered medications for this visit.     Allergies Allergies  Allergen Reactions  . Zithromax [Azithromycin] Itching    ZPAC    Histories Past Medical History:  Diagnosis Date  . Anxiety   . Asthma   . GERD (gastroesophageal reflux disease)     Past Surgical History:  Procedure Laterality Date  . INGUINAL HERNIA REPAIR Left    Social History   Socioeconomic History  . Marital status: Married    Spouse name: Not on file  . Number of children: 3  . Years of education: Not on file  . Highest education level: Not on file  Occupational History  . Occupation: Animal nutritionist  Social Needs  . Financial resource strain: Not on file  . Food insecurity:    Worry: Not on file    Inability: Not on file  . Transportation needs:    Medical: Not on file    Non-medical: Not on file  Tobacco Use  . Smoking status: Former Smoker    Types: Cigarettes    Last attempt to quit: 03/01/2015    Years since quitting: 3.0  . Smokeless tobacco: Never Used  Substance and Sexual Activity  . Alcohol use: Yes    Comment: occ  . Drug use: No  . Sexual activity: Not on file  Lifestyle  . Physical activity:    Days per week: Not on file    Minutes per session: Not on file  . Stress: Not on file  Relationships  . Social connections:    Talks on phone: Not on file    Gets together: Not on file    Attends religious service: Not on file    Active member of club or organization: Not on file    Attends meetings of clubs or organizations: Not on file    Relationship status: Not on file  . Intimate partner violence:    Fear of current or ex partner: Not on file    Emotionally abused: Not on file    Physically abused: Not on file    Forced sexual activity: Not on file  Other Topics Concern  . Not on file  Social History Narrative  . Not on file   Family History  Problem Relation Age of Onset  . Prostate cancer Mother   . Bronchitis Mother   . Prostate cancer Father   . Lung cancer Father   . Arthritis Brother   . Colon cancer Neg Hx   . Esophageal cancer Neg Hx   . Inflammatory bowel disease Neg Hx   . Liver disease Neg Hx   . Pancreatic cancer Neg Hx   . Rectal cancer Neg Hx   . Stomach cancer Neg Hx    I have reviewed his  medical, social, and family history in detail and updated the electronic medical record as necessary.    PHYSICAL EXAMINATION  BP 118/78 (BP Location: Left Arm, Patient Position: Sitting, Cuff Size: Normal)   Pulse 84   Ht 6' (1.829 m) Comment: height measured without shoes  Wt 203 lb 8 oz (92.3 kg)   BMI 27.60 kg/m  Wt Readings from Last 3 Encounters:  03/16/18 203 lb 8 oz (92.3 kg)  08/05/16 220 lb (99.8 kg)  GEN: NAD, appears stated  age, doesn't appear chronically ill PSYCH: Cooperative, without pressured speech EYE: Conjunctivae pink, sclerae anicteric ENT: MMM, without oral ulcers, no erythema or exudates noted NECK: Supple CV: RR without R/Gs  RESP: CTAB posteriorly, without wheezing GI: NABS, soft, NT/ND, without rebound or guarding, no HSM appreciated MSK/EXT: No lower extremity edema SKIN: No jaundice NEURO:  Alert & Oriented x 3, no focal deficits   REVIEW OF DATA  I reviewed the following data at the time of this encounter:  GI Procedures and Studies  None to review  Laboratory Studies  Reviewed in epic  Imaging Studies  No relevant studies   ASSESSMENT  Mr. Persky is a 62 y.o. male with a pmh significant for Asthma, Anxiety, GERD.  The patient is seen today for evaluation and management of:  1. Gastroesophageal reflux disease, esophagitis presence not specified   2. Esophageal dysphagia   3. Chest pain, atypical   4. Colon cancer screening    The patient is hemodynamically stable with symptomatic heartburn/reflux.  He has had a long-standing nature of his symptoms and also has some periods of solid food dysphagia.  This particular time point he has no other red flag symptoms however the long-standing nature of his symptoms do require the patient to undergo an endoscopic evaluation to rule out Barrett's esophagus as well as to rule out etiologies for his dysphasia including rings/webs/strictures/malignancy.  We will plan to double dose his PPI.  We will also  plan for EOE biopsies to be obtained at time of endoscopy.  We will see how his symptoms go over the course of the coming weeks while we get him scheduled for EGD.  Would also plan to get the patient up-to-date for colon cancer screening and we discussed the need for a colonoscopy as well.  The risks and benefits of endoscopic evaluation were discussed with the patient; these include but are not limited to the risk of perforation, infection, bleeding, missed lesions, lack of diagnosis, severe illness requiring hospitalization, as well as anesthesia and sedation related illnesses.  The patient is agreeable to proceed.  All patient questions were answered, to the best of my ability, and the patient agrees to the aforementioned plan of action with follow-up as indicated.   PLAN  1. Gastroesophageal reflux disease, esophagitis presence not specified - Increase Protonix to 40 mg BID - EGD to be done  2. Esophageal dysphagia - Will plan to rule out EoE with biopsies and be prepared to dilate if necessary  3. Chest pain, atypical - CBC; Future - Comprehensive metabolic panel; Future - Hepatic function panel; Future - TSH; Future - High sensitivity CRP; Future - IBC panel; Future - Hepatitis C antibody; Future  4. Colon cancer screening - Plan for Colonoscopy to get up to date   Orders Placed This Encounter  Procedures  . CBC  . Comprehensive metabolic panel  . Hepatic function panel  . TSH  . High sensitivity CRP  . IBC panel  . Hepatitis C antibody    New Prescriptions   PANTOPRAZOLE (PROTONIX) 40 MG TABLET    Take 1 tablet (40 mg total) by mouth 2 (two) times daily.   Modified Medications   No medications on file    Planned Follow Up: No follow-ups on file.   Justice Britain, MD [facility] Office # [phone]

## 2018-03-16 NOTE — Patient Instructions (Signed)
We have sent the following medications to your pharmacy for you to pick up at your convenience: Protonix  We will contact you in the next few weeks to schedule your December procedure.  Thank you for entrusting me with your care and choosing [facility].  Dr Rush Landmark

## 2018-03-17 LAB — HEPATITIS C ANTIBODY
Hepatitis C Ab: NONREACTIVE
SIGNAL TO CUT-OFF: 0.03 (ref <1.00)

## 2018-03-19 ENCOUNTER — Encounter: Payer: Self-pay | Admitting: Gastroenterology

## 2018-03-20 DIAGNOSIS — Z1211 Encounter for screening for malignant neoplasm of colon: Secondary | ICD-10-CM | POA: Insufficient documentation

## 2018-03-20 DIAGNOSIS — R1319 Other dysphagia: Secondary | ICD-10-CM | POA: Insufficient documentation

## 2018-03-20 DIAGNOSIS — R0789 Other chest pain: Secondary | ICD-10-CM | POA: Insufficient documentation

## 2018-03-20 DIAGNOSIS — K219 Gastro-esophageal reflux disease without esophagitis: Secondary | ICD-10-CM | POA: Insufficient documentation

## 2018-03-20 DIAGNOSIS — R1310 Dysphagia, unspecified: Secondary | ICD-10-CM | POA: Insufficient documentation

## 2018-03-30 ENCOUNTER — Encounter: Payer: Self-pay | Admitting: Gastroenterology

## 2018-04-24 ENCOUNTER — Telehealth: Payer: Self-pay | Admitting: Gastroenterology

## 2018-04-24 NOTE — Telephone Encounter (Signed)
The pt states he has been having worse GERD and chest burning and pressure that is not relieved by protonix.  He states he has lost weight and can't eat anything.  He has a colon endo scheduled for 12/24.  He was advised on anti reflux precautions advised if he has chest pain and pressure he should go to the ED for evaluation.  He states he has had this same pressure in the past and the ED gave him GI cocktail and that has helped.  He states he has had a cardiac work up and it was negative.  He states he will follow up with urgent care or ED.

## 2018-04-24 NOTE — Telephone Encounter (Signed)
Left message on machine to call back  

## 2018-05-10 ENCOUNTER — Ambulatory Visit (AMBULATORY_SURGERY_CENTER): Payer: Self-pay

## 2018-05-10 VITALS — Ht 72.0 in | Wt 198.8 lb

## 2018-05-10 DIAGNOSIS — K219 Gastro-esophageal reflux disease without esophagitis: Secondary | ICD-10-CM

## 2018-05-10 DIAGNOSIS — Z1211 Encounter for screening for malignant neoplasm of colon: Secondary | ICD-10-CM

## 2018-05-10 MED ORDER — PEG 3350-KCL-NA BICARB-NACL 420 G PO SOLR: 4000.0000 mL | Freq: Once | ORAL | 0 refills | Status: AC

## 2018-05-10 NOTE — Progress Notes (Signed)
Per pt, no allergies to soy or egg products.Pt not taking any weight loss meds or using  O2 at home.  Pt refused emmi video. 

## 2018-05-11 ENCOUNTER — Encounter: Payer: Self-pay | Admitting: Gastroenterology

## 2018-05-23 ENCOUNTER — Encounter: Payer: Self-pay | Admitting: Gastroenterology

## 2018-05-23 ENCOUNTER — Ambulatory Visit (AMBULATORY_SURGERY_CENTER): Payer: 59 | Admitting: Gastroenterology

## 2018-05-23 VITALS — BP 151/93 | HR 69 | Temp 98.0°F | Resp 19 | Ht 72.0 in | Wt 203.0 lb

## 2018-05-23 DIAGNOSIS — K222 Esophageal obstruction: Secondary | ICD-10-CM | POA: Diagnosis not present

## 2018-05-23 DIAGNOSIS — K621 Rectal polyp: Secondary | ICD-10-CM

## 2018-05-23 DIAGNOSIS — Z1211 Encounter for screening for malignant neoplasm of colon: Secondary | ICD-10-CM | POA: Diagnosis present

## 2018-05-23 DIAGNOSIS — K449 Diaphragmatic hernia without obstruction or gangrene: Secondary | ICD-10-CM | POA: Diagnosis not present

## 2018-05-23 DIAGNOSIS — K219 Gastro-esophageal reflux disease without esophagitis: Secondary | ICD-10-CM | POA: Diagnosis not present

## 2018-05-23 DIAGNOSIS — D123 Benign neoplasm of transverse colon: Secondary | ICD-10-CM | POA: Diagnosis not present

## 2018-05-23 DIAGNOSIS — D127 Benign neoplasm of rectosigmoid junction: Secondary | ICD-10-CM

## 2018-05-23 HISTORY — PX: OTHER SURGICAL HISTORY: SHX169

## 2018-05-23 MED ORDER — SODIUM CHLORIDE 0.9 % IV SOLN: 500.0000 mL | Freq: Once | INTRAVENOUS | Status: DC

## 2018-05-23 NOTE — Addendum Note (Signed)
Addended by: Darron Doom on: 05/23/2018 10:57 AM   Modules accepted: Orders

## 2018-05-23 NOTE — Patient Instructions (Addendum)
Handouts Provided:  Polyps, High Fiber diet and Diverticulosis  START Colace twice Daily and Miralax every other days to have at least 1 bowel movement daily.  YOU HAD AN ENDOSCOPIC PROCEDURE TODAY AT [facility]:   Refer to the procedure report that was given to you for any specific questions about what was found during the examination.  If the procedure report does not answer your questions, please call your gastroenterologist to clarify.  If you requested that your care partner not be given the details of your procedure findings, then the procedure report has been included in a sealed envelope for you to review at your convenience later.  YOU SHOULD EXPECT: Some feelings of bloating in the abdomen. Passage of more gas than usual.  Walking can help get rid of the air that was put into your GI tract during the procedure and reduce the bloating. If you had a lower endoscopy (such as a colonoscopy or flexible sigmoidoscopy) you may notice spotting of blood in your stool or on the toilet paper. If you underwent a bowel prep for your procedure, you may not have a normal bowel movement for a few days.  Please Note:  You might notice some irritation and congestion in your nose or some drainage.  This is from the oxygen used during your procedure.  There is no need for concern and it should clear up in a day or so.  SYMPTOMS TO REPORT IMMEDIATELY:   Following lower endoscopy (colonoscopy or flexible sigmoidoscopy):  Excessive amounts of blood in the stool  Significant tenderness or worsening of abdominal pains  Swelling of the abdomen that is new, acute  Fever of 100F or higher   Following upper endoscopy (EGD)  Vomiting of blood or coffee ground material  New chest pain or pain under the shoulder blades  Painful or persistently difficult swallowing  New shortness of breath  Fever of 100F or higher  Black, tarry-looking stools  For urgent or emergent issues, a  gastroenterologist can be reached at any hour by calling [phone].   DIET:  We do recommend a small meal at first, but then you may proceed to your regular diet.  Drink plenty of fluids but you should avoid alcoholic beverages for 24 hours.  ACTIVITY:  You should plan to take it easy for the rest of today and you should NOT DRIVE or use heavy machinery until tomorrow (because of the sedation medicines used during the test).    FOLLOW UP: Our staff will call the number listed on your records the next business day following your procedure to check on you and address any questions or concerns that you may have regarding the information given to you following your procedure. If we do not reach you, we will leave a message.  However, if you are feeling well and you are not experiencing any problems, there is no need to return our call.  We will assume that you have returned to your regular daily activities without incident.  If any biopsies were taken you will be contacted by phone or by letter within the next 1-3 weeks.  Please call us at [phone] if you have not heard about the biopsies in 3 weeks.    SIGNATURES/CONFIDENTIALITY: You and/or your care partner have signed paperwork which will be entered into your electronic medical record.  These signatures attest to the fact that that the information above on your After Visit Summary has been reviewed and is understood.  Full responsibility of the confidentiality of this discharge information lies with you and/or your care-partner.

## 2018-05-23 NOTE — Progress Notes (Signed)
Called to room to assist during endoscopic procedure.  Patient ID and intended procedure confirmed with present staff. Received instructions for my participation in the procedure from the performing physician.  

## 2018-05-23 NOTE — Op Note (Signed)
[facility] Patient Name: Justin English Procedure Date: 05/23/2018 7:12 AM MRN: [id number] Endoscopist: Justice Britain , MD Age: 62 Referring MD:  Date of Birth: 1956/03/14 Gender: Male Account #: [id number] Procedure:                Colonoscopy Indications:              Screening for colorectal malignant neoplasm Medicines:                Monitored Anesthesia Care Procedure:                Pre-Anesthesia Assessment:                           - Prior to the procedure, a History and Physical                            was performed, and patient medications and                            allergies were reviewed. The patient's tolerance of                            previous anesthesia was also reviewed. The risks                            and benefits of the procedure and the sedation                            options and risks were discussed with the patient.                            All questions were answered, and informed consent                            was obtained. Prior Anticoagulants: The patient has                            taken no previous anticoagulant or antiplatelet                            agents. ASA Grade Assessment: II - A patient with                            mild systemic disease. After reviewing the risks                            and benefits, the patient was deemed in                            satisfactory condition to undergo the procedure.                           After obtaining informed consent, the colonoscope  was passed under direct vision. Throughout the                            procedure, the patient's blood pressure, pulse, and                            oxygen saturations were monitored continuously. The                            Colonoscope was introduced through the anus and                            advanced to the the cecum, identified by                            appendiceal orifice and  ileocecal valve. The                            colonoscopy was performed without difficulty. The                            patient tolerated the procedure. The quality of the                            bowel preparation was evaluated using the BBPS                            [facility] Bowel Preparation Scale) with scores of:                            Right Colon = 2 (minor amount of residual staining,                            small fragments of stool and/or opaque liquid, but                            mucosa seen well), Transverse Colon = 3 (entire                            mucosa seen well with no residual staining, small                            fragments of stool or opaque liquid) and Left Colon                            = 3 (entire mucosa seen well with no residual                            staining, small fragments of stool or opaque                            liquid). The total BBPS score equals 8. The quality  of the bowel preparation was good. Findings:                 The digital rectal exam findings include                            non-thrombosed internal hemorrhoids. Pertinent                            negatives include no palpable rectal lesions.                           A 5 mm polyp was found in the transverse colon. The                            polyp was sessile. The polyp was removed with a                            cold snare. Resection and retrieval were complete.                           Multiple small and large-mouthed diverticula were                            found in the recto-sigmoid colon and sigmoid colon.                           Normal mucosa was found in the entire colon                            otherwise.                           Non-bleeding non-thrombosed internal hemorrhoids                            were found during retroflexion, during perianal                            exam and during digital exam. The  hemorrhoids were                            Grade III (internal hemorrhoids that prolapse but                            require manual reduction). Complications:            No immediate complications. Estimated Blood Loss:     Estimated blood loss was minimal. Impression:               - Non-thrombosed internal hemorrhoids found on                            digital rectal exam.                           - One 5 mm  polyp in the transverse colon, removed                            with a cold snare. Resected and retrieved.                           - Diverticulosis in the recto-sigmoid colon and in                            the sigmoid colon.                           - Normal mucosa in the entire examined colon                            otherwise.                           - Non-bleeding non-thrombosed internal hemorrhoids. Recommendation:           - The patient will be observed post-procedure,                            until all discharge criteria are met.                           - Discharge patient to home.                           - Patient has a contact number available for                            emergencies. The signs and symptoms of potential                            delayed complications were discussed with the                            patient. Return to normal activities tomorrow.                            Written discharge instructions were provided to the                            patient.                           - Resume previous diet.                           - High fiber diet.                           - Start Colace BID and Miralax every other day to                            have at  least 1 bowel movement daily.                           - Await pathology results.                           - Repeat colonoscopy in 5-10 years for surveillance                            based on pathology results and findings of                            adenomatous  tissue.                           - The findings and recommendations were discussed                            with the patient.                           - The findings and recommendations were discussed                            with the patient's family. Justice Britain, MD 05/23/2018 8:50:55 AM

## 2018-05-23 NOTE — Op Note (Signed)
[facility] Patient Name: Justin English Procedure Date: 05/23/2018 7:14 AM MRN: [id number] Endoscopist: Justice Britain , MD Age: 62 Referring MD:  Date of Birth: 04-07-1956 Gender: Male Account #: [id number] Procedure:                Upper GI endoscopy Indications:              Dysphagia, Unexplained chest pain Medicines:                Monitored Anesthesia Care Procedure:                Pre-Anesthesia Assessment:                           - Prior to the procedure, a History and Physical                            was performed, and patient medications and                            allergies were reviewed. The patient's tolerance of                            previous anesthesia was also reviewed. The risks                            and benefits of the procedure and the sedation                            options and risks were discussed with the patient.                            All questions were answered, and informed consent                            was obtained. Prior Anticoagulants: The patient has                            taken no previous anticoagulant or antiplatelet                            agents. ASA Grade Assessment: II - A patient with                            mild systemic disease. After reviewing the risks                            and benefits, the patient was deemed in                            satisfactory condition to undergo the procedure.                           After obtaining informed consent, the endoscope was  passed under direct vision. Throughout the                            procedure, the patient's blood pressure, pulse, and                            oxygen saturations were monitored continuously. The                            Model GIF-[id number]) scope was introduced                            through the mouth, and advanced to the second part                            of duodenum. The  upper GI endoscopy was                            accomplished without difficulty. The patient                            tolerated the procedure. Scope In: 8:14:21 AM Scope Out: 8:31:32 AM Scope Withdrawal Time: 0 hours 13 minutes 19 seconds  Total Procedure Duration: 0 hours 17 minutes 11 seconds  Findings:                 No gross lesions were noted in the proximal                            esophagus, in the mid esophagus and in the distal                            esophagus. Biopsies were taken with a cold forceps                            for histology from the proximal and middle                            esophagus for EoE rule out. Biopsies were taken                            with a cold forceps for histology from the distal                            esophagus for EoE rule out.                           A widely patent and non-obstructing Schatzki ring                            was found at the gastroesophageal junction.                            Biopsies were taken with a cold forceps  for                            histology and to disrupt the ring.                           A small hiatal hernia was found. The proximal                            extent of the gastric folds (end of tubular                            esophagus) was 40 cm from the incisors. The hiatal                            narrowing was 42 cm from the incisors. The Z-line                            was 39.5 cm from the incisors.                           The gastroesophageal flap valve was visualized                            endoscopically and classified as Hill Grade II                            (fold present, opens with respiration).                           No gross lesions were noted in the entire examined                            stomach. Biopsies were taken with a cold forceps                            for histology and Helicobacter pylori testing from                            the  antrum/incisura/greater curve/lesser curve.                           No gross lesions were noted in the duodenal bulb,                            in the first portion of the duodenum and in the                            second portion of the duodenum. Complications:            No immediate complications. Estimated Blood Loss:     Estimated blood loss was minimal. Impression:               - No gross lesions in esophagus. Biopsied for EoE.                           -  Widely patent and non-obstructing Schatzki ring.                            Biopsied for disruption.                           - Small hiatal hernia. Gastroesophageal flap valve                            classified as Hill Grade II (fold present, opens                            with respiration).                           - No gross lesions in the stomach. Biopsied for HP.                           - No gross lesions in the duodenal bulb, in the                            first portion of the duodenum and in the second                            portion of the duodenum. Recommendation:           - Proceed to scheduled colonoscopy.                           - Await pathology results.                           - Continue PPI BID for next 20-months.                           - Based on findings will consider repeat EGD in                            future for repeat disruption of Schatzki ring via                            biopsy or via dilation - based on patient symptoms.                           - The findings and recommendations were discussed                            with the patient.                           - The findings and recommendations were discussed                            with the patient's family. Justice Britain, MD 05/23/2018 8:47:35 AM

## 2018-05-23 NOTE — Progress Notes (Signed)
Pt's states no medical or surgical changes since previsit or office visit. 

## 2018-05-23 NOTE — Progress Notes (Signed)
Report given to PACU, vss 

## 2018-05-25 ENCOUNTER — Telehealth: Payer: Self-pay | Admitting: *Deleted

## 2018-05-25 NOTE — Telephone Encounter (Signed)
Pt returned your call stating that he is doing well and is back to work today.

## 2018-05-25 NOTE — Telephone Encounter (Signed)
  Follow up Call-  Call back number 05/23/2018  Post procedure Call Back phone  # [phone]  Permission to leave phone message Yes  Some recent data might be hidden     Left message to return call if concerns or questions

## 2018-05-29 DIAGNOSIS — I10 Essential (primary) hypertension: Secondary | ICD-10-CM

## 2018-05-29 DIAGNOSIS — D62 Acute posthemorrhagic anemia: Secondary | ICD-10-CM | POA: Diagnosis not present

## 2018-05-29 DIAGNOSIS — K922 Gastrointestinal hemorrhage, unspecified: Secondary | ICD-10-CM | POA: Diagnosis not present

## 2018-05-30 DIAGNOSIS — I10 Essential (primary) hypertension: Secondary | ICD-10-CM | POA: Diagnosis not present

## 2018-05-30 DIAGNOSIS — D62 Acute posthemorrhagic anemia: Secondary | ICD-10-CM | POA: Diagnosis not present

## 2018-05-30 DIAGNOSIS — K922 Gastrointestinal hemorrhage, unspecified: Secondary | ICD-10-CM | POA: Diagnosis not present

## 2018-06-03 ENCOUNTER — Encounter: Payer: Self-pay | Admitting: Gastroenterology

## 2018-06-05 ENCOUNTER — Telehealth: Payer: Self-pay | Admitting: Gastroenterology

## 2018-06-05 NOTE — Telephone Encounter (Signed)
Follow up with Dr Rush Landmark on 07/18/18 at 130 pm

## 2018-06-05 NOTE — Telephone Encounter (Signed)
The pt was advised to come in at 2/18 for appt to discuss symptoms.

## 2018-06-05 NOTE — Telephone Encounter (Signed)
Patient states he is still having GERD symptoms since procedure on 12.24.19 and would like to see what Dr.Mansouraty advises him to do.

## 2018-06-17 ENCOUNTER — Other Ambulatory Visit: Payer: Self-pay

## 2018-06-17 ENCOUNTER — Encounter (HOSPITAL_COMMUNITY): Payer: Self-pay

## 2018-06-17 ENCOUNTER — Emergency Department (HOSPITAL_COMMUNITY): Payer: 59

## 2018-06-17 ENCOUNTER — Observation Stay (HOSPITAL_COMMUNITY)
Admission: EM | Admit: 2018-06-17 | Discharge: 2018-06-20 | Disposition: A | Discharge status: Home / Self Care | Payer: 59 | Attending: Internal Medicine | Admitting: Internal Medicine

## 2018-06-17 DIAGNOSIS — Z87891 Personal history of nicotine dependence: Secondary | ICD-10-CM | POA: Insufficient documentation

## 2018-06-17 DIAGNOSIS — R0789 Other chest pain: Secondary | ICD-10-CM | POA: Diagnosis not present

## 2018-06-17 DIAGNOSIS — R030 Elevated blood-pressure reading, without diagnosis of hypertension: Secondary | ICD-10-CM

## 2018-06-17 DIAGNOSIS — J432 Centrilobular emphysema: Secondary | ICD-10-CM | POA: Insufficient documentation

## 2018-06-17 DIAGNOSIS — J45909 Unspecified asthma, uncomplicated: Secondary | ICD-10-CM | POA: Insufficient documentation

## 2018-06-17 DIAGNOSIS — Z79899 Other long term (current) drug therapy: Secondary | ICD-10-CM | POA: Diagnosis not present

## 2018-06-17 DIAGNOSIS — K449 Diaphragmatic hernia without obstruction or gangrene: Secondary | ICD-10-CM | POA: Diagnosis not present

## 2018-06-17 DIAGNOSIS — R0602 Shortness of breath: Secondary | ICD-10-CM

## 2018-06-17 DIAGNOSIS — I700 Atherosclerosis of aorta: Secondary | ICD-10-CM | POA: Insufficient documentation

## 2018-06-17 DIAGNOSIS — I2510 Atherosclerotic heart disease of native coronary artery without angina pectoris: Secondary | ICD-10-CM | POA: Diagnosis not present

## 2018-06-17 DIAGNOSIS — K219 Gastro-esophageal reflux disease without esophagitis: Secondary | ICD-10-CM | POA: Diagnosis not present

## 2018-06-17 DIAGNOSIS — R142 Eructation: Secondary | ICD-10-CM

## 2018-06-17 DIAGNOSIS — Z881 Allergy status to other antibiotic agents status: Secondary | ICD-10-CM | POA: Insufficient documentation

## 2018-06-17 DIAGNOSIS — R079 Chest pain, unspecified: Secondary | ICD-10-CM | POA: Diagnosis present

## 2018-06-17 LAB — COMPREHENSIVE METABOLIC PANEL
ALBUMIN: 4.7 g/dL (ref 3.5–5.0)
ALT: 23 U/L (ref 0–44)
AST: 18 U/L (ref 15–41)
Alkaline Phosphatase: 70 U/L (ref 38–126)
Anion gap: 8 (ref 5–15)
BUN: 22 mg/dL — ABNORMAL HIGH (ref 6–20)
CO2: 27 mmol/L (ref 22–32)
Calcium: 9.9 mg/dL (ref 8.9–10.3)
Chloride: 105 mmol/L (ref 98–111)
Creatinine, Ser: 1.36 mg/dL — ABNORMAL HIGH (ref 0.61–1.24)
GFR calc Af Amer: >60 mL/min (ref >60)
GFR calc non Af Amer: 56 mL/min — ABNORMAL LOW (ref >60)
Glucose, Bld: 104 mg/dL — ABNORMAL HIGH (ref 70–99)
Potassium: 3.9 mmol/L (ref 3.5–5.1)
Sodium: 140 mmol/L (ref 135–145)
Total Bilirubin: 0.7 mg/dL (ref 0.3–1.2)
Total Protein: 8.2 g/dL — ABNORMAL HIGH (ref 6.5–8.1)

## 2018-06-17 LAB — I-STAT TROPONIN, ED: Troponin i, poc: 0 ng/mL (ref 0.00–0.08)

## 2018-06-17 LAB — CBC WITH DIFFERENTIAL/PLATELET
Abs Immature Granulocytes: 0.13 10*3/uL — ABNORMAL HIGH (ref 0.00–0.07)
Basophils Absolute: 0.1 10*3/uL (ref 0.0–0.1)
Basophils Relative: 1 %
Eosinophils Absolute: 0.3 10*3/uL (ref 0.0–0.5)
Eosinophils Relative: 3 %
HCT: 49.3 % (ref 39.0–52.0)
Hemoglobin: 16.4 g/dL (ref 13.0–17.0)
Immature Granulocytes: 1 %
Lymphocytes Relative: 31 %
Lymphs Abs: 3.2 10*3/uL (ref 0.7–4.0)
MCH: 31.2 pg (ref 26.0–34.0)
MCHC: 33.3 g/dL (ref 30.0–36.0)
MCV: 93.9 fL (ref 80.0–100.0)
MONO ABS: 0.7 10*3/uL (ref 0.1–1.0)
Monocytes Relative: 7 %
NEUTROS ABS: 5.7 10*3/uL (ref 1.7–7.7)
Neutrophils Relative %: 57 %
Platelets: 322 10*3/uL (ref 150–400)
RBC: 5.25 MIL/uL (ref 4.22–5.81)
RDW: 12 % (ref 11.5–15.5)
WBC: 10.1 10*3/uL (ref 4.0–10.5)
nRBC: 0 % (ref 0.0–0.2)

## 2018-06-17 MED ORDER — SODIUM CHLORIDE (PF) 0.9 % IJ SOLN
INTRAMUSCULAR | Status: AC
  Filled 2018-06-17: qty 50

## 2018-06-17 MED ORDER — NITROGLYCERIN 2 % TD OINT
1.0000 [in_us] | TOPICAL_OINTMENT | Freq: Four times a day (QID) | TRANSDERMAL | Status: DC
  Administered 2018-06-18 – 2018-06-19 (×6): 1 [in_us] via TOPICAL
  Filled 2018-06-17 (×2): qty 30

## 2018-06-17 MED ORDER — ALUM & MAG HYDROXIDE-SIMETH 200-200-20 MG/5ML PO SUSP
30.0000 mL | Freq: Once | ORAL | Status: AC
  Administered 2018-06-17: 30 mL via ORAL
  Filled 2018-06-17: qty 30

## 2018-06-17 MED ORDER — SUCRALFATE 1 G PO TABS: 1.0000 g | ORAL_TABLET | Freq: Three times a day (TID) | ORAL | 0 refills | Status: DC

## 2018-06-17 MED ORDER — IOPAMIDOL (ISOVUE-300) INJECTION 61%
INTRAVENOUS | Status: AC
  Filled 2018-06-17: qty 100

## 2018-06-17 MED ORDER — LIDOCAINE VISCOUS HCL 2 % MT SOLN
15.0000 mL | Freq: Once | OROMUCOSAL | Status: AC
  Administered 2018-06-17: 15 mL via ORAL
  Filled 2018-06-17: qty 15

## 2018-06-17 MED ORDER — ASPIRIN 81 MG PO CHEW
324.0000 mg | CHEWABLE_TABLET | Freq: Once | ORAL | Status: AC
  Administered 2018-06-18: 324 mg via ORAL
  Filled 2018-06-17: qty 4

## 2018-06-17 MED ORDER — IOPAMIDOL (ISOVUE-300) INJECTION 61%
100.0000 mL | Freq: Once | INTRAVENOUS | Status: AC | PRN
  Administered 2018-06-17: 100 mL via INTRAVENOUS

## 2018-06-17 NOTE — ED Provider Notes (Addendum)
Medical screening examination/treatment/procedure(s) were conducted as a shared visit with non-physician practitioner(s) and myself.  I personally evaluated the patient during the encounter.  EKG Interpretation  Date/Time:  Saturday June 17 2018 19:39:20 EST Ventricular Rate:  85 PR Interval:    QRS Duration: 102 QT Interval:  363 QTC Calculation: 432 R Axis:   -75 Text Interpretation:  Sinus rhythm Left anterior fascicular block Abnormal R-wave progression, late transition Baseline wander in lead(s) V3 No significant change since last tracing Confirmed by Dorie Rank [phone]) on 06/17/2018 7:43:24 PM Patient has had recurrent problems with feeling of fullness and pressure in his central chest that comes with associated feeling of shortness of breath.  This is often relieved by belching.  Patient reports is unpredictable as to when the symptoms might start.  He reports they do seem to be increasing in frequency.  Patient has been on high-dose PPI but symptoms are advancing.  Patient is alert and appropriate.  No acute distress.  Heart and lung exam normal.  Abdomen soft without guarding.  Patient has had increasing central chest pressure with associated shortness of breath.  An element of this sounds gastrointestinal in origin.  However symptoms advancing despite treatment.  Will obtain CT chest to rule out any structural etiology.  If within normal limits, patient stable for discharge and follow-up with cardiology and PCP on outpatient basis.  CT chest identifies notable LAD calcifications.  PA-C. Valere Dross will consult cardiology for anginal equivalent symptoms.   Consult: Have reviewed Dr. Lovena Le, cardiology fellow.  Suggest admission to hospitalist and consult cardiology.  Consult: Have reviewed and tried hospitalist for admission. Charlesetta Shanks, MD 06/17/18 2025    Charlesetta Shanks, MD 06/17/18 [id number]    Charlesetta Shanks, MD 06/18/18 [phone]

## 2018-06-17 NOTE — ED Provider Notes (Signed)
[facility] DEPT Provider Note   CSN: [id number] Arrival date & time: 06/17/18  1923     History   Chief Complaint Chief Complaint  Patient presents with  . Gastroesophageal Reflux    HPI Justin English is a 63 y.o. male.  HPI   Patient is a 63 year old male with a past medical history of GERD, allergic rhinitis, and asthma presenting for frequent eructation.  He reports that is been progressively worsening since December 24 when he had an EGD performed.  Patient reports that he is gone through periods and of his life where he will have excessive eructation, however currently is experiencing it daily.  He does report that it may be slightly worse with exertion.  He reports that the episodes become so frequent that he feels short of breath from it.  Denies any chest pain or pressure.  Denies nausea, vomiting or abdominal pain.  Denies excessive flatulence.  Patient denies any foods preceded.  Patient denies any personal history of cardiac disease.  He denies any family history of early cardiac disease.  He does report that he quit smoking 3 years ago.  Patient reports he quit alcohol use many years ago but used to frequently drink alcohol.  Patient's EGD report on 05-23-2018 demonstrated small hiatal hernia, and nonobstructive Schatzki ring.  Past Medical History:  Diagnosis Date  . Allergy    seasonal  . Anxiety   . Asthma   . GERD (gastroesophageal reflux disease)   . Hemorrhoids   . Sinus infection    hx of frequent sinus infection    Patient Active Problem List   Diagnosis Date Noted  . Gastroesophageal reflux disease 03/20/2018  . Chest pain, atypical 03/20/2018  . Esophageal dysphagia 03/20/2018  . Colon cancer screening 03/20/2018    Past Surgical History:  Procedure Laterality Date  . INGUINAL HERNIA REPAIR Left    was 63 years old        Home Medications    Prior to Admission medications   Medication Sig Start Date End  Date Taking? Authorizing Provider  busPIRone (BUSPAR) 15 MG tablet Take 15 mg by mouth as needed.    [provider]  chlorpheniramine (CHLOR-TRIMETON) 4 MG tablet Take 4 mg by mouth as needed for allergies.    [provider]  ibuprofen (ADVIL,MOTRIN) 200 MG tablet Take 200 mg by mouth as needed for moderate pain.     [provider]  loratadine (CLARITIN) 10 MG tablet Take 10 mg by mouth daily. 07/08/16   [provider]  montelukast (SINGULAIR) 10 MG tablet Take 10 mg by mouth as needed.    [provider]  pantoprazole (PROTONIX) 40 MG tablet Take 40 mg by mouth daily. 07/19/15   [provider]  QVAR 80 MCG/ACT inhaler Inhale 2 puffs into the lungs as needed.  06/27/16   [provider]  Simethicone (MYLANTA GAS PO) Take 1 Dose by mouth as needed.    [provider]    Family History Family History  Problem Relation Age of Onset  . Bronchitis Mother   . Prostate cancer Father   . Lung cancer Father   . Arthritis Brother   . Colon cancer Neg Hx   . Esophageal cancer Neg Hx   . Inflammatory bowel disease Neg Hx   . Liver disease Neg Hx   . Pancreatic cancer Neg Hx   . Rectal cancer Neg Hx   . Stomach cancer Neg Hx  Social History Social History   Tobacco Use  . Smoking status: Former Smoker    Types: Cigarettes    Last attempt to quit: 03/01/2015    Years since quitting: 3.2  . Smokeless tobacco: Never Used  Substance Use Topics  . Alcohol use: Not Currently  . Drug use: No     Allergies   Zithromax [azithromycin]   Review of Systems Review of Systems  Constitutional: Negative for chills and fever.  HENT: Negative for congestion and sore throat.   Eyes: Negative for visual disturbance.  Respiratory: Negative for cough, chest tightness and shortness of breath.   Cardiovascular: Negative for chest pain, palpitations and leg swelling.  Gastrointestinal: Negative for abdominal pain, constipation,  diarrhea, nausea and vomiting.       +Eructation.   Genitourinary: Negative for dysuria and flank pain.  Musculoskeletal: Negative for back pain and myalgias.  Skin: Negative for rash.  Neurological: Negative for dizziness and syncope.     Physical Exam Updated Vital Signs BP (!) 159/106 (BP Location: Right Arm)   Pulse 74   Temp (!) 97.5 F (36.4 C) (Oral)   Resp 16   Ht 6\' 1"  (1.854 m)   Wt 90.7 kg   SpO2 100%   BMI 26.39 kg/m   Physical Exam Vitals signs and nursing note reviewed.  Constitutional:      General: He is not in acute distress.    Appearance: He is well-developed.  HENT:     Head: Normocephalic and atraumatic.  Eyes:     Conjunctiva/sclera: Conjunctivae normal.     Pupils: Pupils are equal, round, and reactive to light.  Neck:     Musculoskeletal: Normal range of motion and neck supple.  Cardiovascular:     Rate and Rhythm: Normal rate and regular rhythm.     Pulses:          Radial pulses are 2+ on the right side and 2+ on the left side.       Dorsalis pedis pulses are 2+ on the right side and 2+ on the left side.     Heart sounds: S1 normal and S2 normal. No murmur.     Comments: No lower extremity edema.  No calf tenderness. Pulmonary:     Effort: Pulmonary effort is normal.     Breath sounds: Normal breath sounds. No wheezing or rales.  Abdominal:     General: There is no distension.     Palpations: Abdomen is soft.     Tenderness: There is no abdominal tenderness. There is no guarding.  Musculoskeletal: Normal range of motion.        General: No deformity.  Lymphadenopathy:     Cervical: No cervical adenopathy.  Skin:    General: Skin is warm and dry.     Findings: No erythema or rash.  Neurological:     Mental Status: He is alert.     Comments: Cranial nerves grossly intact. Patient moves extremities symmetrically and with good coordination.  Psychiatric:        Behavior: Behavior normal.        Thought Content: Thought content normal.         Judgment: Judgment normal.      ED Treatments / Results  Labs (all labs ordered are listed, but only abnormal results are displayed) Labs Reviewed  CBC WITH DIFFERENTIAL/PLATELET - Abnormal; Notable for the following components:      Result Value   Abs Immature Granulocytes 0.13 (*)  All other components within normal limits  COMPREHENSIVE METABOLIC PANEL - Abnormal; Notable for the following components:   Glucose, Bld 104 (*)    BUN 22 (*)    Creatinine, Ser 1.36 (*)    Total Protein 8.2 (*)    GFR calc non Af Amer 56 (*)    All other components within normal limits  I-STAT TROPONIN, ED    EKG EKG Interpretation  Date/Time:  Saturday June 17 2018 19:39:20 EST Ventricular Rate:  85 PR Interval:    QRS Duration: 102 QT Interval:  363 QTC Calculation: 432 R Axis:   -75 Text Interpretation:  Sinus rhythm Left anterior fascicular block Abnormal R-wave progression, late transition Baseline wander in lead(s) V3 No significant change since last tracing Confirmed by Dorie Rank [phone]) on 06/17/2018 7:43:24 PM   Radiology Dg Chest 2 View  Result Date: 06/17/2018 CLINICAL DATA:  Pt reports that he has been having non-stop burping pt states it has been making him feel SOB, and some Dizziness. Pt has hx of acid reflux worsening the past week. Pt HX: asthma, ex smokerChest discomfort EXAM: CHEST - 2 VIEW COMPARISON:  08/15/2016 FINDINGS: Normal mediastinum and cardiac silhouette. Normal pulmonary vasculature. No evidence of effusion, infiltrate, or pneumothorax. No acute bony abnormality. IMPRESSION: No acute cardiopulmonary process. Electronically Signed   By: Suzy Bouchard M.D.   On: 06/17/2018 20:43    Procedures Procedures (including critical care time)  Medications Ordered in ED Medications  alum & mag hydroxide-simeth (MAALOX/MYLANTA) 200-200-20 MG/5ML suspension 30 mL (30 mLs Oral Given 06/17/18 2133)    And  lidocaine (XYLOCAINE) 2 % viscous mouth solution 15  mL (15 mLs Oral Given 06/17/18 2133)     Initial Impression / Assessment and Plan / ED Course  I have reviewed the triage vital signs and the nursing notes.  Pertinent labs & imaging results that were available during my care of the patient were reviewed by me and considered in my medical decision making (see chart for details).     Patient nontoxic-appearing, afebrile, and in no acute distress.  Diagnosis includes atypical angina, esophageal dysmotility, worsening GERD, lymphadenopathy of the chest, structural abnormalities in the mediastinum around the esophagus.  Doubt bowel obstruction, as patient is having no abdominal pain, and is passing flatus and stool.  Patient has a heart score of 2 for age and risk factors.  Pt has a nonischemic cardiac work-up today with troponin of 0 and unchanged EKG without evidence of ischemia, infarction, or arrhythmia. However, with exertional symptoms, patient may be experiencing angina.   No abnormalities on CBC.  Patient is slight elevation in creatinine of 1.36.  Will have patient check outpatient.  CT chest is pending to assess for any structural abnormalities in the mediastinum.  11:39 PM CT chest is demonstrating prominent coronary artery calcifications within the LAD.  Raises concern for anginal symptoms with eructation and shortness of breath with exertion.  Will consult cardiology. Call signed out to Dr. Johnney Killian to follow.   This is a shared visit with Dr. Charlesetta Shanks. Patient was independently evaluated by this attending physician. Attending physician consulted in evaluation and management.  Final Clinical Impressions(s) / ED Diagnoses   Final diagnoses:  Eructation  Shortness of breath  Elevated blood pressure reading in office without diagnosis of hypertension    ED Discharge Orders         Ordered    Ambulatory referral to Cardiology     06/17/18 2256    sucralfate (  CARAFATE) 1 g tablet  3 times daily with meals & bedtime      06/17/18 2257           Tamala Julian 06/17/18 2341    Charlesetta Shanks, MD 06/18/18 [phone]

## 2018-06-17 NOTE — Discharge Instructions (Addendum)
Please see the information and instructions below regarding your visit.  Your diagnoses today include:  1. Eructation   2. Shortness of breath     Tests performed today include: See side panel of your discharge paperwork for testing performed today. Vital signs are listed at the bottom of these instructions.   Medications prescribed:    Take any prescribed medications only as prescribed, and any over the counter medications only as directed on the packaging.  Please start taking a medicine called Carafate or sucralfate.  This can be taken up to 4 times a day, with meals and before bedtime.  Please do not take your proton pump inhibitor (Protonix, Nexium, Prilosec) within 30 minutes of taking Carafate.  Home care instructions:  Please follow any educational materials contained in this packet.   Follow-up instructions: Please follow-up with your primary care provider within the next week for further evaluation of your symptoms if they are not completely improved.   Please follow up with Cardiology. They should be reaching out to you.   Return instructions:   Please return to the Emergency Department if you experience worsening symptoms.   Please return to the emergency department if you develop any chest pain, shortness of breath, worsening symptoms, dizziness, lightheadedness, feeling that you are going to pass out.  Please return if you have any other emergent concerns.  Additional Information:   Your vital signs today were: BP (!) 159/106 (BP Location: Right Arm)    Pulse 74    Temp (!) 97.5 F (36.4 C) (Oral)    Resp 16    Ht 6\' 1"  (1.854 m)    Wt 90.7 kg    SpO2 100%    BMI 26.39 kg/m  If your blood pressure (BP) was elevated on multiple readings during this visit above 130 for the top number or above 80 for the bottom number, please have this repeated by your primary care provider within one month. --------------  Thank you for allowing [place] to participate in your care  today.     Radial Site Care  This sheet gives you information about how to care for yourself after your procedure. Your health care provider may also give you more specific instructions. If you have problems or questions, contact your health care provider. What can I expect after the procedure? After the procedure, it is common to have:  Bruising and tenderness at the catheter insertion area. Follow these instructions at home: Medicines  Take over-the-counter and prescription medicines only as told by your health care provider. Insertion site care  Follow instructions from your health care provider about how to take care of your insertion site. Make sure you: ? Wash your hands with soap and water before you change your bandage (dressing). If soap and water are not available, use hand sanitizer. ? Change your dressing as told by your health care provider. ? Leave stitches (sutures), skin glue, or adhesive strips in place. These skin closures may need to stay in place for 2 weeks or longer. If adhesive strip edges start to loosen and curl up, you may trim the loose edges. Do not remove adhesive strips completely unless your health care provider tells you to do that.  Check your insertion site every day for signs of infection. Check for: ? Redness, swelling, or pain. ? Fluid or blood. ? Pus or a bad smell. ? Warmth.  Do not take baths, swim, or use a hot tub until your health care provider approves.  You may shower 24-48 hours after the procedure, or as directed by your health care provider. ? Remove the dressing and gently wash the site with plain soap and water. ? Pat the area dry with a clean towel. ? Do not rub the site. That could cause bleeding.  Do not apply powder or lotion to the site. Activity   For 24 hours after the procedure, or as directed by your health care provider: ? Do not flex or bend the affected arm. ? Do not push or pull heavy objects with the affected  arm. ? Do not drive yourself home from the hospital or clinic. You may drive 24 hours after the procedure unless your health care provider tells you not to. ? Do not operate machinery or power tools.  Do not lift anything that is heavier than 10 lb (4.5 kg), or the limit that you are told, until your health care provider says that it is safe.  Ask your health care provider when it is okay to: ? Return to work or school. ? Resume usual physical activities or sports. ? Resume sexual activity. General instructions  If the catheter site starts to bleed, raise your arm and put firm pressure on the site. If the bleeding does not stop, get help right away. This is a medical emergency.  If you went home on the same day as your procedure, a responsible adult should be with you for the first 24 hours after you arrive home.  Keep all follow-up visits as told by your health care provider. This is important. Contact a health care provider if:  You have a fever.  You have redness, swelling, or yellow drainage around your insertion site. Get help right away if:  You have unusual pain at the radial site.  The catheter insertion area swells very fast.  The insertion area is bleeding, and the bleeding does not stop when you hold steady pressure on the area.  Your arm or hand becomes pale, cool, tingly, or numb. These symptoms may represent a serious problem that is an emergency. Do not wait to see if the symptoms will go away. Get medical help right away. Call your local emergency services (911 in the [place] Do not drive yourself to the hospital. Summary  After the procedure, it is common to have bruising and tenderness at the site.  Follow instructions from your health care provider about how to take care of your radial site wound. Check the wound every day for signs of infection.  Do not lift anything that is heavier than 10 lb (4.5 kg), or the limit that you are told, until your health care  provider says that it is safe. This information is not intended to replace advice given to you by your health care provider. Make sure you discuss any questions you have with your health care provider. Document Released: 06/19/2010 Document Revised: 06/22/2017 Document Reviewed: 06/22/2017 Elsevier Interactive Patient Education  2019 [employer].

## 2018-06-17 NOTE — ED Triage Notes (Signed)
Pt reports that he has been having non-stop burping pt states it has been making him feel SOB, and some  Dizziness.   Pt has hx of acid reflux worsening the past week.  Pt states that he has a hernia.

## 2018-06-18 ENCOUNTER — Encounter (HOSPITAL_COMMUNITY): Payer: Self-pay | Admitting: Internal Medicine

## 2018-06-18 ENCOUNTER — Ambulatory Visit (HOSPITAL_COMMUNITY)
Admission: RE | Admit: 2018-06-18 | Discharge: 2018-06-18 | Disposition: A | Discharge status: Home / Self Care | Payer: 59 | Source: Ambulatory Visit | Attending: Cardiovascular Disease | Admitting: Cardiovascular Disease

## 2018-06-18 DIAGNOSIS — R079 Chest pain, unspecified: Secondary | ICD-10-CM

## 2018-06-18 DIAGNOSIS — I200 Unstable angina: Secondary | ICD-10-CM | POA: Insufficient documentation

## 2018-06-18 LAB — NM MYOCAR MULTI W/SPECT W/WALL MOTION / EF
CSEPPHR: 131 {beats}/min
Estimated workload: 1 METS
MPHR: 160 {beats}/min
Percent HR: 81 %
Rest HR: 83 {beats}/min

## 2018-06-18 LAB — CBC
HCT: 46.1 % (ref 39.0–52.0)
Hemoglobin: 15 g/dL (ref 13.0–17.0)
MCH: 30.6 pg (ref 26.0–34.0)
MCHC: 32.5 g/dL (ref 30.0–36.0)
MCV: 94.1 fL (ref 80.0–100.0)
Platelets: 289 10*3/uL (ref 150–400)
RBC: 4.9 MIL/uL (ref 4.22–5.81)
RDW: 12.1 % (ref 11.5–15.5)
WBC: 7.3 10*3/uL (ref 4.0–10.5)
nRBC: 0 % (ref 0.0–0.2)

## 2018-06-18 LAB — LIPID PANEL
Cholesterol: 149 mg/dL (ref 0–200)
HDL: 30 mg/dL — ABNORMAL LOW (ref >40)
LDL Cholesterol: 102 mg/dL — ABNORMAL HIGH (ref 0–99)
TRIGLYCERIDES: 84 mg/dL (ref <150)
Total CHOL/HDL Ratio: 5 RATIO
VLDL: 17 mg/dL (ref 0–40)

## 2018-06-18 LAB — TROPONIN I
Troponin I: <0.03 ng/mL (ref <0.03)
Troponin I: <0.03 ng/mL (ref <0.03)
Troponin I: <0.03 ng/mL (ref <0.03)

## 2018-06-18 LAB — CREATININE, SERUM
Creatinine, Ser: 1.4 mg/dL — ABNORMAL HIGH (ref 0.61–1.24)
GFR calc Af Amer: >60 mL/min (ref >60)
GFR calc non Af Amer: 54 mL/min — ABNORMAL LOW (ref >60)

## 2018-06-18 MED ORDER — ALBUTEROL SULFATE (2.5 MG/3ML) 0.083% IN NEBU: 2.5000 mg | INHALATION_SOLUTION | Freq: Four times a day (QID) | RESPIRATORY_TRACT | Status: DC | PRN

## 2018-06-18 MED ORDER — ENOXAPARIN SODIUM 40 MG/0.4ML ~~LOC~~ SOLN
40.0000 mg | SUBCUTANEOUS | Status: DC
  Administered 2018-06-18: 40 mg via SUBCUTANEOUS
  Filled 2018-06-18: qty 0.4

## 2018-06-18 MED ORDER — ASPIRIN 81 MG PO CHEW
81.0000 mg | CHEWABLE_TABLET | ORAL | Status: AC
  Administered 2018-06-19: 81 mg via ORAL
  Filled 2018-06-18: qty 1

## 2018-06-18 MED ORDER — TECHNETIUM TC 99M TETROFOSMIN IV KIT
30.0000 | PACK | Freq: Once | INTRAVENOUS | Status: AC | PRN
  Administered 2018-06-18: 30 via INTRAVENOUS

## 2018-06-18 MED ORDER — SODIUM CHLORIDE 0.9 % IV SOLN: 250.0000 mL | INTRAVENOUS | Status: DC | PRN

## 2018-06-18 MED ORDER — SODIUM CHLORIDE 0.9% FLUSH: 3.0000 mL | INTRAVENOUS | Status: DC | PRN

## 2018-06-18 MED ORDER — ALBUTEROL SULFATE HFA 108 (90 BASE) MCG/ACT IN AERS: 1.0000 | INHALATION_SPRAY | Freq: Four times a day (QID) | RESPIRATORY_TRACT | Status: DC | PRN

## 2018-06-18 MED ORDER — REGADENOSON 0.4 MG/5ML IV SOLN
0.4000 mg | Freq: Once | INTRAVENOUS | Status: AC
  Administered 2018-06-18: 0.4 mg via INTRAVENOUS
  Filled 2018-06-18: qty 5

## 2018-06-18 MED ORDER — TRAZODONE HCL 50 MG PO TABS
50.0000 mg | ORAL_TABLET | Freq: Every day | ORAL | Status: DC
  Administered 2018-06-18 – 2018-06-19 (×2): 50 mg via ORAL
  Filled 2018-06-18 (×2): qty 1

## 2018-06-18 MED ORDER — HYDRALAZINE HCL 20 MG/ML IJ SOLN: 5.0000 mg | INTRAMUSCULAR | Status: DC | PRN

## 2018-06-18 MED ORDER — ASPIRIN EC 325 MG PO TBEC
325.0000 mg | DELAYED_RELEASE_TABLET | Freq: Every day | ORAL | Status: DC
  Administered 2018-06-18: 325 mg via ORAL
  Filled 2018-06-18: qty 1

## 2018-06-18 MED ORDER — SODIUM CHLORIDE 0.9 % WEIGHT BASED INFUSION: 1.0000 mL/kg/h | INTRAVENOUS | Status: DC

## 2018-06-18 MED ORDER — SODIUM CHLORIDE 0.9 % WEIGHT BASED INFUSION: 3.0000 mL/kg/h | INTRAVENOUS | Status: DC

## 2018-06-18 MED ORDER — SODIUM CHLORIDE 0.9 % IV SOLN
INTRAVENOUS | Status: DC
  Administered 2018-06-18 – 2018-06-20 (×5): via INTRAVENOUS

## 2018-06-18 MED ORDER — REGADENOSON 0.4 MG/5ML IV SOLN
INTRAVENOUS | Status: AC
  Filled 2018-06-18: qty 5

## 2018-06-18 MED ORDER — TECHNETIUM TC 99M TETROFOSMIN IV KIT
10.0000 | PACK | Freq: Once | INTRAVENOUS | Status: AC | PRN
  Administered 2018-06-18: 10 via INTRAVENOUS

## 2018-06-18 MED ORDER — SODIUM CHLORIDE 0.9% FLUSH: 3.0000 mL | Freq: Two times a day (BID) | INTRAVENOUS | Status: DC

## 2018-06-18 MED ORDER — PANTOPRAZOLE SODIUM 40 MG PO TBEC
40.0000 mg | DELAYED_RELEASE_TABLET | Freq: Two times a day (BID) | ORAL | Status: DC
  Administered 2018-06-18 – 2018-06-20 (×3): 40 mg via ORAL
  Filled 2018-06-18 (×6): qty 1

## 2018-06-18 MED ORDER — ONDANSETRON HCL 4 MG/2ML IJ SOLN: 4.0000 mg | Freq: Four times a day (QID) | INTRAMUSCULAR | Status: DC | PRN

## 2018-06-18 MED ORDER — ACETAMINOPHEN 325 MG PO TABS: 650.0000 mg | ORAL_TABLET | ORAL | Status: DC | PRN

## 2018-06-18 MED ORDER — BUSPIRONE HCL 5 MG PO TABS
15.0000 mg | ORAL_TABLET | Freq: Every day | ORAL | Status: DC | PRN
  Administered 2018-06-18: 15 mg via ORAL
  Filled 2018-06-18 (×2): qty 1.5

## 2018-06-18 NOTE — Plan of Care (Signed)
63 year old male with hypertension admitted by my partner this morning with chest discomfort.  Patient has had GI work-up and GI treatments as an outpatient which did not help with his chest discomfort.  CT of the chest showed extensive calcification in the LAD distribution.  Cardiology consulted and appreciate their input.  Patient to have a Lexi scan today.

## 2018-06-18 NOTE — H&P (View-Only) (Signed)
Stress test complete. Patient denies any complaints. Patient taken to nuclear medicine.

## 2018-06-18 NOTE — H&P (Signed)
History and Physical    Justin English JOA:[id number] DOB: 1955-07-11 DOA: 06/17/2018  PCP: Everardo Beals, NP  Patient coming from: Home.  Chief Complaint: Discomfort.  HPI: Justin English is a 63 y.o. male with history of asthma, GERD who has had a EGD and colonoscopy on May 23 2018 about a month ago for symptoms of GERD which showed small hiatal hernia but otherwise unremarkable and was discharged home on Protonix 40 twice daily which patient has been taking daily comes to the ER because of chest discomfort.  Patient states he has long standing symptoms of GERD but last 1 month it has acutely worsened.  Prompting to have EGD done.  But despite taking the Protonix patient symptoms still persist and patient's office nursing practitioner advised to come to the ER to rule out cardiac etiologies.  Patient is chest discomfort happens even at rest and states it gets better on walking.  It is a sensation of fullness at times getting better with burping.  Denies nausea vomiting or diarrhea.  Has some associated shortness of breath.  No fever or chills.  ED Course: In the ER EKG shows normal sinus rhythm troponin negative.  CAT scan of the chest done with contrast shows calcification of the LAD and on-call cardiologist was consulted at this time the requested admission for further observation.  Review of Systems: As per HPI, rest all negative.   Past Medical History:  Diagnosis Date  . Allergy    seasonal  . Anxiety   . Asthma   . GERD (gastroesophageal reflux disease)   . Hemorrhoids   . Sinus infection    hx of frequent sinus infection    Past Surgical History:  Procedure Laterality Date  . INGUINAL HERNIA REPAIR Left    was 63 years old     reports that he quit smoking about 3 years ago. His smoking use included cigarettes. He has never used smokeless tobacco. He reports previous alcohol use. He reports that he does not use drugs.  Allergies  Allergen Reactions  .  Zithromax [Azithromycin] Itching    ZPAC/ caused hives    Family History  Problem Relation Age of Onset  . Bronchitis Mother   . Prostate cancer Father   . Lung cancer Father   . Arthritis Brother   . Colon cancer Neg Hx   . Esophageal cancer Neg Hx   . Inflammatory bowel disease Neg Hx   . Liver disease Neg Hx   . Pancreatic cancer Neg Hx   . Rectal cancer Neg Hx   . Stomach cancer Neg Hx     Prior to Admission medications   Medication Sig Start Date End Date Taking? Authorizing Provider  albuterol (PROVENTIL HFA;VENTOLIN HFA) 108 (90 Base) MCG/ACT inhaler Inhale 1-2 puffs into the lungs every 6 (six) hours as needed for wheezing or shortness of breath.   Yes [provider]  busPIRone (BUSPAR) 15 MG tablet Take 15 mg by mouth daily as needed (anxiety).    Yes [provider]  chlorpheniramine (CHLOR-TRIMETON) 4 MG tablet Take 4 mg by mouth as needed for allergies.   Yes [provider]  pantoprazole (PROTONIX) 40 MG tablet Take 40 mg by mouth 2 (two) times daily.  07/19/15  Yes [provider]  QVAR 80 MCG/ACT inhaler Inhale 2 puffs into the lungs daily as needed (SOB, wheezing).  06/27/16  Yes [provider]  sucralfate (CARAFATE) 1 g tablet Take 1 tablet (1 g total)  by mouth 4 (four) times daily -  with meals and at bedtime for 14 days. 06/17/18 07/01/18  Albesa Seen, PA-C    Physical Exam: Vitals:   06/17/18 1929 06/17/18 1947 06/17/18 2235  BP: (!) 143/106 (!) 146/98 (!) 159/106  Pulse: 84  74  Resp: 18  16  Temp: (!) 97.5 F (36.4 C)    TempSrc: Oral    SpO2: 100%  100%  Weight: 90.7 kg    Height: 6\' 1"  (1.854 m)        Constitutional: Moderately built and nourished. Vitals:   06/17/18 1929 06/17/18 1947 06/17/18 2235  BP: (!) 143/106 (!) 146/98 (!) 159/106  Pulse: 84  74  Resp: 18  16  Temp: (!) 97.5 F (36.4 C)    TempSrc: Oral    SpO2: 100%  100%  Weight: 90.7 kg    Height: 6\' 1"  (1.854 m)     Eyes:  Anicteric no pallor. ENMT: No discharge from the ears eyes nose or mouth. Neck: No mass felt.  No neck rigidity. Respiratory: No rhonchi or crepitations. Cardiovascular: S1-S2 heard. Abdomen: Soft nontender bowel sounds present. Musculoskeletal: No edema.  No joint effusion. Skin: No rash. Neurologic: Alert awake oriented to time place and person.  Moves all extremities. Psychiatric: Appears normal.  Normal affect.   Labs on Admission: I have personally reviewed following labs and imaging studies  CBC: Recent Labs  Lab 06/17/18 2145  WBC 10.1  NEUTROABS 5.7  HGB 16.4  HCT 49.3  MCV 93.9  PLT 161   Basic Metabolic Panel: Recent Labs  Lab 06/17/18 2145  NA 140  K 3.9  CL 105  CO2 27  GLUCOSE 104*  BUN 22*  CREATININE 1.36*  CALCIUM 9.9   GFR: Estimated Creatinine Clearance: 65.3 mL/min (A) (by C-G formula based on SCr of 1.36 mg/dL (H)). Liver Function Tests: Recent Labs  Lab 06/17/18 2145  AST 18  ALT 23  ALKPHOS 70  BILITOT 0.7  PROT 8.2*  ALBUMIN 4.7   No results for input(s): LIPASE, AMYLASE in the last 168 hours. No results for input(s): AMMONIA in the last 168 hours. Coagulation Profile: No results for input(s): INR, PROTIME in the last 168 hours. Cardiac Enzymes: No results for input(s): CKTOTAL, CKMB, CKMBINDEX, TROPONINI in the last 168 hours. BNP (last 3 results) No results for input(s): PROBNP in the last 8760 hours. HbA1C: No results for input(s): HGBA1C in the last 72 hours. CBG: No results for input(s): GLUCAP in the last 168 hours. Lipid Profile: No results for input(s): CHOL, HDL, LDLCALC, TRIG, CHOLHDL, LDLDIRECT in the last 72 hours. Thyroid Function Tests: No results for input(s): TSH, T4TOTAL, FREET4, T3FREE, THYROIDAB in the last 72 hours. Anemia Panel: No results for input(s): VITAMINB12, FOLATE, FERRITIN, TIBC, IRON, RETICCTPCT in the last 72 hours. Urine analysis:    Component Value Date/Time   COLORURINE COLORLESS (A)  08/05/2016 1236   APPEARANCEUR CLEAR 08/05/2016 1236   LABSPEC 1.001 (L) 08/05/2016 1236   PHURINE 8.0 08/05/2016 1236   GLUCOSEU NEGATIVE 08/05/2016 1236   HGBUR NEGATIVE 08/05/2016 1236   BILIRUBINUR NEGATIVE 08/05/2016 1236   KETONESUR NEGATIVE 08/05/2016 1236   PROTEINUR NEGATIVE 08/05/2016 1236   NITRITE NEGATIVE 08/05/2016 1236   LEUKOCYTESUR NEGATIVE 08/05/2016 1236   Sepsis Labs: @LABRCNTIP (procalcitonin:4,lacticidven:4) )No results found for this or any previous visit (from the past 240 hour(s)).   Radiological Exams on Admission: Dg Chest 2 View  Result Date: 06/17/2018 CLINICAL DATA:  Pt reports  that he has been having non-stop burping pt states it has been making him feel SOB, and some Dizziness. Pt has hx of acid reflux worsening the past week. Pt HX: asthma, ex smokerChest discomfort EXAM: CHEST - 2 VIEW COMPARISON:  08/15/2016 FINDINGS: Normal mediastinum and cardiac silhouette. Normal pulmonary vasculature. No evidence of effusion, infiltrate, or pneumothorax. No acute bony abnormality. IMPRESSION: No acute cardiopulmonary process. Electronically Signed   By: Suzy Bouchard M.D.   On: 06/17/2018 20:43   Ct Chest W Contrast  Result Date: 06/17/2018 CLINICAL DATA:  Initial evaluation for acute shortness of breath. History of reflux disease. EXAM: CT CHEST WITH CONTRAST TECHNIQUE: Multidetector CT imaging of the chest was performed during intravenous contrast administration. CONTRAST:  162mL ISOVUE-300 IOPAMIDOL (ISOVUE-300) INJECTION 61% COMPARISON:  Prior radiograph from earlier the same day. FINDINGS: Cardiovascular: Intrathoracic aorta normal in caliber without aneurysm or other acute finding. Mild scattered atheromatous plaque within the aortic arch. Visualized great vessels within normal limits. Heart size normal. No pericardial effusion. Prominent coronary artery calcifications noted within the LAD. Limited evaluation of the pulmonary arterial tree grossly unremarkable.  Mediastinum/Nodes: Visualized thyroid within normal limits. No pathologically enlarged mediastinal, hilar or axillary lymph nodes. Esophagus within normal limits. Small hiatal hernia noted. Lungs/Pleura: Tracheal air column intact and widely patent. Lungs well inflated bilaterally. Minimal subsegmental atelectatic changes seen dependently within the lower lobes bilaterally. Mild centrilobular emphysema at the bilateral lung apices. No focal infiltrates. No pulmonary edema or pleural effusion. No pneumothorax. No worrisome pulmonary nodule or mass. Upper Abdomen: Subcentimeter hypodensity within left hepatic lobe noted, too small the characterize, but may reflect a small cyst. Visualized upper abdomen otherwise unremarkable. Musculoskeletal: External soft tissues demonstrate no acute finding. No acute osseous abnormality. No discrete lytic or blastic osseous lesions. IMPRESSION: 1. No CT evidence for acute cardiopulmonary abnormality. 2. Mild upper lobe centrilobular emphysema. 3. Small hiatal hernia. 4. Mild aortic atherosclerosis with prominent coronary artery calcifications within the LAD. Electronically Signed   By: Jeannine Boga M.D.   On: 06/17/2018 23:31    EKG: Independently reviewed.  Normal sinus rhythm.  Assessment/Plan Principal Problem:   Chest pain Active Problems:   Gastroesophageal reflux disease    1. Chest pain -has some atypical features but CAT scan shows prominent coronary artery calcification within the LAD.  We will keep patient on aspirin and is already on nitroglycerin patch.  We will cycle cardiac markers cardiology has been notified.  N.p.o. in a.m. anticipation of procedure. 2. GERD on Protonix twice daily. 3. History of asthma takes inhalers as needed. 4. Elevated blood pressure -closely follow blood pressure trends for now keep patient on PRN IV hydralazine.   DVT prophylaxis: Lovenox. Code Status: Full code. Family Communication: Discussed with  patient. Disposition Plan: Home. Consults called: Cardiology notified. Admission status: Observation.   Rise Patience MD [facility] Pager [phone].  If 7PM-7AM, please contact night-coverage [email or web address] Password [email or web address]  06/18/2018, 1:49 AM

## 2018-06-18 NOTE — Progress Notes (Signed)
Verbal Order obtained from [facility], Cardiology that patient will have Wheeler for stress test.

## 2018-06-18 NOTE — ED Notes (Signed)
ED TO INPATIENT HANDOFF REPORT  Name/Age/Gender Justin English 63 y.o. male  Code Status   Home/SNF/Other Home  Chief Complaint Faint,  Weakness, Indigestion /Chest Pain   Level of Care/Admitting Diagnosis ED Disposition    ED Disposition Condition [facility] Area: [place] [[id number]]  Level of Care: Telemetry [5]  Admit to tele based on following criteria: Monitor for Ischemic changes  Diagnosis: Chest pain [481856]  Admitting Physician: Rise Patience [[id number]]  Attending Physician: Rise Patience [phone]  PT Class (Do Not Modify): Observation [104]  PT Acc Code (Do Not Modify): Observation [10022]       Medical History Past Medical History:  Diagnosis Date  . Allergy    seasonal  . Anxiety   . Asthma   . GERD (gastroesophageal reflux disease)   . Hemorrhoids   . Sinus infection    hx of frequent sinus infection    Allergies Allergies  Allergen Reactions  . Zithromax [Azithromycin] Itching    ZPAC/ caused hives    IV Location/Drains/Wounds Patient Lines/Drains/Airways Status   Active Line/Drains/Airways    Name:   Placement date:   Placement time:   Site:   Days:   Peripheral IV 06/17/18 Left;Medial Antecubital   06/17/18    2147    Antecubital   1          Labs/Imaging Results for orders placed or performed during the hospital encounter of 06/17/18 (from the past 48 hour(s))  CBC with Differential     Status: Abnormal   Collection Time: 06/17/18  9:45 PM  Result Value Ref Range   WBC 10.1 4.0 - 10.5 K/uL   RBC 5.25 4.22 - 5.81 MIL/uL   Hemoglobin 16.4 13.0 - 17.0 g/dL   HCT 49.3 39.0 - 52.0 %   MCV 93.9 80.0 - 100.0 fL   MCH 31.2 26.0 - 34.0 pg   MCHC 33.3 30.0 - 36.0 g/dL   RDW 12.0 11.5 - 15.5 %   Platelets 322 150 - 400 K/uL   nRBC 0.0 0.0 - 0.2 %   Neutrophils Relative % 57 %   Neutro Abs 5.7 1.7 - 7.7 K/uL   Lymphocytes Relative 31 %   Lymphs Abs 3.2 0.7 - 4.0 K/uL   Monocytes Relative  7 %   Monocytes Absolute 0.7 0.1 - 1.0 K/uL   Eosinophils Relative 3 %   Eosinophils Absolute 0.3 0.0 - 0.5 K/uL   Basophils Relative 1 %   Basophils Absolute 0.1 0.0 - 0.1 K/uL   Immature Granulocytes 1 %   Abs Immature Granulocytes 0.13 (H) 0.00 - 0.07 K/uL    Comment: Performed at [facility], [place] [address], [place], [place] [address]  Comprehensive metabolic panel     Status: Abnormal   Collection Time: 06/17/18  9:45 PM  Result Value Ref Range   Sodium 140 135 - 145 mmol/L   Potassium 3.9 3.5 - 5.1 mmol/L   Chloride 105 98 - 111 mmol/L   CO2 27 22 - 32 mmol/L   Glucose, Bld 104 (H) 70 - 99 mg/dL   BUN 22 (H) 6 - 20 mg/dL   Creatinine, Ser 1.36 (H) 0.61 - 1.24 mg/dL   Calcium 9.9 8.9 - 10.3 mg/dL   Total Protein 8.2 (H) 6.5 - 8.1 g/dL   Albumin 4.7 3.5 - 5.0 g/dL   AST 18 15 - 41 U/L   ALT 23 0 - 44 U/L  Alkaline Phosphatase 70 38 - 126 U/L   Total Bilirubin 0.7 0.3 - 1.2 mg/dL   GFR calc non Af Amer 56 (L) >60 mL/min   GFR calc Af Amer >60 >60 mL/min   Anion gap 8 5 - 15    Comment: Performed at [facility], [place] [address], [place],  [address]  I-Stat Troponin, ED (not at [facility])     Status: None   Collection Time: 06/17/18  9:53 PM  Result Value Ref Range   Troponin i, poc 0.00 0.00 - 0.08 ng/mL   Comment 3            Comment: Due to the release kinetics of cTnI, a negative result within the first hours of the onset of symptoms does not rule out myocardial infarction with certainty. If myocardial infarction is still suspected, repeat the test at appropriate intervals.    Dg Chest 2 View  Result Date: 06/17/2018 CLINICAL DATA:  Pt reports that he has been having non-stop burping pt states it has been making him feel SOB, and some Dizziness. Pt has hx of acid reflux worsening the past week. Pt HX: asthma, ex smokerChest discomfort EXAM: CHEST - 2 VIEW COMPARISON:  08/15/2016 FINDINGS: Normal mediastinum and cardiac  silhouette. Normal pulmonary vasculature. No evidence of effusion, infiltrate, or pneumothorax. No acute bony abnormality. IMPRESSION: No acute cardiopulmonary process. Electronically Signed   By: Suzy Bouchard M.D.   On: 06/17/2018 20:43   Ct Chest W Contrast  Result Date: 06/17/2018 CLINICAL DATA:  Initial evaluation for acute shortness of breath. History of reflux disease. EXAM: CT CHEST WITH CONTRAST TECHNIQUE: Multidetector CT imaging of the chest was performed during intravenous contrast administration. CONTRAST:  145mL ISOVUE-300 IOPAMIDOL (ISOVUE-300) INJECTION 61% COMPARISON:  Prior radiograph from earlier the same day. FINDINGS: Cardiovascular: Intrathoracic aorta normal in caliber without aneurysm or other acute finding. Mild scattered atheromatous plaque within the aortic arch. Visualized great vessels within normal limits. Heart size normal. No pericardial effusion. Prominent coronary artery calcifications noted within the LAD. Limited evaluation of the pulmonary arterial tree grossly unremarkable. Mediastinum/Nodes: Visualized thyroid within normal limits. No pathologically enlarged mediastinal, hilar or axillary lymph nodes. Esophagus within normal limits. Small hiatal hernia noted. Lungs/Pleura: Tracheal air column intact and widely patent. Lungs well inflated bilaterally. Minimal subsegmental atelectatic changes seen dependently within the lower lobes bilaterally. Mild centrilobular emphysema at the bilateral lung apices. No focal infiltrates. No pulmonary edema or pleural effusion. No pneumothorax. No worrisome pulmonary nodule or mass. Upper Abdomen: Subcentimeter hypodensity within left hepatic lobe noted, too small the characterize, but may reflect a small cyst. Visualized upper abdomen otherwise unremarkable. Musculoskeletal: External soft tissues demonstrate no acute finding. No acute osseous abnormality. No discrete lytic or blastic osseous lesions. IMPRESSION: 1. No CT evidence for  acute cardiopulmonary abnormality. 2. Mild upper lobe centrilobular emphysema. 3. Small hiatal hernia. 4. Mild aortic atherosclerosis with prominent coronary artery calcifications within the LAD. Electronically Signed   By: Jeannine Boga M.D.   On: 06/17/2018 23:31   EKG Interpretation  Date/Time:  Saturday June 17 2018 19:39:20 EST Ventricular Rate:  85 PR Interval:    QRS Duration: 102 QT Interval:  363 QTC Calculation: 432 R Axis:   -75 Text Interpretation:  Sinus rhythm Left anterior fascicular block Abnormal R-wave progression, late transition Baseline wander in lead(s) V3 No significant change since last tracing Confirmed by Dorie Rank [id number]) on 06/17/2018 7:43:24 PM   Pending Labs Unresulted Labs (From admission, onward)  None      Vitals/Pain Today's Vitals   06/17/18 1929 06/17/18 1947 06/17/18 2100 06/17/18 2235  BP: (!) 143/106 (!) 146/98  (!) 159/106  Pulse: 84   74  Resp: 18   16  Temp: (!) 97.5 F (36.4 C)     TempSrc: Oral     SpO2: 100%   100%  Weight: 90.7 kg     Height: 6\' 1"  (1.854 m)     PainSc:   10-Worst pain ever     Isolation Precautions No active isolations  Medications Medications  iopamidol (ISOVUE-300) 61 % injection (has no administration in time range)  sodium chloride (PF) 0.9 % injection (has no administration in time range)  nitroGLYCERIN (NITROGLYN) 2 % ointment 1 inch (has no administration in time range)  aspirin chewable tablet 324 mg (has no administration in time range)  alum & mag hydroxide-simeth (MAALOX/MYLANTA) 200-200-20 MG/5ML suspension 30 mL (30 mLs Oral Given 06/17/18 2133)    And  lidocaine (XYLOCAINE) 2 % viscous mouth solution 15 mL (15 mLs Oral Given 06/17/18 2133)  iopamidol (ISOVUE-300) 61 % injection 100 mL (100 mLs Intravenous Contrast Given 06/17/18 2309)    Mobility walks

## 2018-06-18 NOTE — Progress Notes (Signed)
Stress test complete. Patient denies any complaints. Patient taken to nuclear medicine.

## 2018-06-18 NOTE — Consult Note (Signed)
Cardiology Consultation:   Patient ID: [name] MRN: [id number]; DOB: 08-04-1955  Admit date: 06/17/2018 Date of Consult: 06/18/2018  Primary Care Provider: Everardo Beals, NP Primary Cardiologist: Reola Calkins Nahser  Primary Electrophysiologist:     Patient Profile:   Justin English is a 63 y.o. male with a hx of asthma, gastroesophageal reflux who is being seen today for the evaluation of chest pain at the request of Dr. Rodena Piety.  Marland Kitchen  History of Present Illness:   [name] is a 63 year old gentleman with a history of gastric esophageal reflux and asthma.  He presented to the [facility] long emergency room with episodes of chest discomfort.  The patient had a CT angiogram in the emergency room for further evaluation for a possible aortic dissection.  There is no evidence of aortic dissection. The patient was noted to have calcifications in the LAD distribution.  Troponin levels are negative.  Past Medical History:  Diagnosis Date  . Allergy    seasonal  . Anxiety   . Asthma   . GERD (gastroesophageal reflux disease)   . Hemorrhoids   . Sinus infection    hx of frequent sinus infection    Past Surgical History:  Procedure Laterality Date  . INGUINAL HERNIA REPAIR Left    was 63 years old     Home Medications:  Prior to Admission medications   Medication Sig Start Date End Date Taking? Authorizing Provider  albuterol (PROVENTIL HFA;VENTOLIN HFA) 108 (90 Base) MCG/ACT inhaler Inhale 1-2 puffs into the lungs every 6 (six) hours as needed for wheezing or shortness of breath.   Yes [provider]  busPIRone (BUSPAR) 15 MG tablet Take 15 mg by mouth daily as needed (anxiety).    Yes [provider]  chlorpheniramine (CHLOR-TRIMETON) 4 MG tablet Take 4 mg by mouth as needed for allergies.   Yes [provider]  pantoprazole (PROTONIX) 40 MG tablet Take 40 mg by mouth 2 (two) times daily.  07/19/15  Yes [provider]  QVAR 80 MCG/ACT  inhaler Inhale 2 puffs into the lungs daily as needed (SOB, wheezing).  06/27/16  Yes [provider]  sucralfate (CARAFATE) 1 g tablet Take 1 tablet (1 g total) by mouth 4 (four) times daily -  with meals and at bedtime for 14 days. 06/17/18 07/01/18  Albesa Seen, PA-C    Inpatient Medications: Scheduled Meds: . aspirin EC  325 mg Oral Daily  . enoxaparin (LOVENOX) injection  40 mg Subcutaneous Q24H  . iopamidol      . nitroGLYCERIN  1 inch Topical Q6H  . pantoprazole  40 mg Oral BID  . sodium chloride (PF)       Continuous Infusions:  PRN Meds: acetaminophen, albuterol, busPIRone, hydrALAZINE, ondansetron (ZOFRAN) IV  Allergies:    Allergies  Allergen Reactions  . Zithromax [Azithromycin] Itching    ZPAC/ caused hives    Social History:   Social History   Socioeconomic History  . Marital status: Married    Spouse name: Not on file  . Number of children: 3  . Years of education: Not on file  . Highest education level: Not on file  Occupational History  . Occupation: Animal nutritionist  Social Needs  . Financial resource strain: Not on file  . Food insecurity:    Worry: Not on file    Inability: Not on file  . Transportation needs:    Medical: Not on file    Non-medical: Not on file  Tobacco Use  . Smoking status: Former Smoker    Types: Cigarettes    Last attempt to quit: 03/01/2015    Years since quitting: 3.3  . Smokeless tobacco: Never Used  Substance and Sexual Activity  . Alcohol use: Not Currently  . Drug use: No  . Sexual activity: Not on file  Lifestyle  . Physical activity:    Days per week: Not on file    Minutes per session: Not on file  . Stress: Not on file  Relationships  . Social connections:    Talks on phone: Not on file    Gets together: Not on file    Attends religious service: Not on file    Active member of club or organization: Not on file    Attends meetings of clubs or organizations: Not on file    Relationship status: Not on  file  . Intimate partner violence:    Fear of current or ex partner: Not on file    Emotionally abused: Not on file    Physically abused: Not on file    Forced sexual activity: Not on file  Other Topics Concern  . Not on file  Social History Narrative  . Not on file    Family History:    Family History  Problem Relation Age of Onset  . Bronchitis Mother   . Prostate cancer Father   . Lung cancer Father   . Arthritis Brother   . Colon cancer Neg Hx   . Esophageal cancer Neg Hx   . Inflammatory bowel disease Neg Hx   . Liver disease Neg Hx   . Pancreatic cancer Neg Hx   . Rectal cancer Neg Hx   . Stomach cancer Neg Hx      ROS:  Please see the history of present illness.   All other ROS reviewed and negative.     Physical Exam/Data:   Vitals:   06/18/18 0158 06/18/18 0159 06/18/18 0528 06/18/18 0818  BP:  (!) 143/92 109/77 114/85  Pulse:  76 74 73  Resp:  18 18 14   Temp:   97.6 F (36.4 C) 99 F (37.2 C)  TempSrc:   Oral Oral  SpO2:  100% 95% 98%  Weight: 91.2 kg     Height: 6\' 1"  (1.854 m)      No intake or output data in the 24 hours ending 06/18/18 0826 Last 3 Weights 06/18/2018 06/17/2018 05/23/2018  Weight (lbs) 201 lb 200 lb 203 lb  Weight (kg) 91.173 kg 90.719 kg 92.08 kg     Body mass index is 26.52 kg/m.  General:  Well nourished, well developed, in no acute distress HEENT: normal Lymph: no adenopathy Neck: no JVD Endocrine:  No thryomegaly Vascular: No carotid bruits; FA pulses 2+ bilaterally without bruits  Cardiac:  normal S1, S2; RRR; no murmur  Lungs:  clear to auscultation bilaterally, no wheezing, rhonchi or rales  Abd: soft, nontender, no hepatomegaly  Ext: no edema Musculoskeletal:  No deformities, BUE and BLE strength normal and equal Skin: warm and dry  Neuro:  CNs 2-12 intact, no focal abnormalities noted Psych:  Normal affect   EKG:  The EKG was personally reviewed and demonstrates: Normal sinus rhythm at 85.  There are no acute  ST or T wave changes. Telemetry:  Telemetry was personally reviewed and demonstrates: Normal sinus rhythm  Relevant CV Studies:   Laboratory Data:  Chemistry Recent Labs  Lab 06/17/18 2145 06/18/18 0527  NA 140  --  K 3.9  --   CL 105  --   CO2 27  --   GLUCOSE 104*  --   BUN 22*  --   CREATININE 1.36* 1.40*  CALCIUM 9.9  --   GFRNONAA 56* 54*  GFRAA >60 >60  ANIONGAP 8  --     Recent Labs  Lab 06/17/18 2145  PROT 8.2*  ALBUMIN 4.7  AST 18  ALT 23  ALKPHOS 70  BILITOT 0.7   Hematology Recent Labs  Lab 06/17/18 2145 06/18/18 0527  WBC 10.1 7.3  RBC 5.25 4.90  HGB 16.4 15.0  HCT 49.3 46.1  MCV 93.9 94.1  MCH 31.2 30.6  MCHC 33.3 32.5  RDW 12.0 12.1  PLT 322 289   Cardiac Enzymes Recent Labs  Lab 06/18/18 0527  TROPONINI <0.03    Recent Labs  Lab 06/17/18 2153  TROPIPOC 0.00    BNPNo results for input(s): BNP, PROBNP in the last 168 hours.  DDimer No results for input(s): DDIMER in the last 168 hours.  Radiology/Studies:  Dg Chest 2 View  Result Date: 06/17/2018 CLINICAL DATA:  Pt reports that he has been having non-stop burping pt states it has been making him feel SOB, and some Dizziness. Pt has hx of acid reflux worsening the past week. Pt HX: asthma, ex smokerChest discomfort EXAM: CHEST - 2 VIEW COMPARISON:  08/15/2016 FINDINGS: Normal mediastinum and cardiac silhouette. Normal pulmonary vasculature. No evidence of effusion, infiltrate, or pneumothorax. No acute bony abnormality. IMPRESSION: No acute cardiopulmonary process. Electronically Signed   By: Suzy Bouchard M.D.   On: 06/17/2018 20:43   Ct Chest W Contrast  Result Date: 06/17/2018 CLINICAL DATA:  Initial evaluation for acute shortness of breath. History of reflux disease. EXAM: CT CHEST WITH CONTRAST TECHNIQUE: Multidetector CT imaging of the chest was performed during intravenous contrast administration. CONTRAST:  160mL ISOVUE-300 IOPAMIDOL (ISOVUE-300) INJECTION 61% COMPARISON:   Prior radiograph from earlier the same day. FINDINGS: Cardiovascular: Intrathoracic aorta normal in caliber without aneurysm or other acute finding. Mild scattered atheromatous plaque within the aortic arch. Visualized great vessels within normal limits. Heart size normal. No pericardial effusion. Prominent coronary artery calcifications noted within the LAD. Limited evaluation of the pulmonary arterial tree grossly unremarkable. Mediastinum/Nodes: Visualized thyroid within normal limits. No pathologically enlarged mediastinal, hilar or axillary lymph nodes. Esophagus within normal limits. Small hiatal hernia noted. Lungs/Pleura: Tracheal air column intact and widely patent. Lungs well inflated bilaterally. Minimal subsegmental atelectatic changes seen dependently within the lower lobes bilaterally. Mild centrilobular emphysema at the bilateral lung apices. No focal infiltrates. No pulmonary edema or pleural effusion. No pneumothorax. No worrisome pulmonary nodule or mass. Upper Abdomen: Subcentimeter hypodensity within left hepatic lobe noted, too small the characterize, but may reflect a small cyst. Visualized upper abdomen otherwise unremarkable. Musculoskeletal: External soft tissues demonstrate no acute finding. No acute osseous abnormality. No discrete lytic or blastic osseous lesions. IMPRESSION: 1. No CT evidence for acute cardiopulmonary abnormality. 2. Mild upper lobe centrilobular emphysema. 3. Small hiatal hernia. 4. Mild aortic atherosclerosis with prominent coronary artery calcifications within the LAD. Electronically Signed   By: Jeannine Boga M.D.   On: 06/17/2018 23:31    Assessment and Plan:   1. Chest discomfort: The patient presents with several weeks of chest discomfort.  Occasionally these episodes come on with exertion but also can occur at rest.  They are described as a pressure like sensation.  It tends to radiate through to his back.  There is  no radiation to his jaw.  He does  have some shortness of breath associated with this.  CT scan performed last night shows significant calcifications in the LAD distribution.  Troponin levels are negative.  We will schedule him for a stress Myoview study    We will get a fasting lipid profile this morning.     For questions or updates, please contact [place] Please consult [email or web address] for contact info under     Signed, Mertie Moores, MD  06/18/2018 8:26 AM

## 2018-06-19 ENCOUNTER — Ambulatory Visit (HOSPITAL_COMMUNITY)
Admission: EM | Disposition: A | Discharge status: Home / Self Care | Payer: Self-pay | Source: Home / Self Care | Attending: Emergency Medicine

## 2018-06-19 ENCOUNTER — Observation Stay (HOSPITAL_BASED_OUTPATIENT_CLINIC_OR_DEPARTMENT_OTHER): Payer: 59

## 2018-06-19 DIAGNOSIS — K219 Gastro-esophageal reflux disease without esophagitis: Secondary | ICD-10-CM | POA: Diagnosis not present

## 2018-06-19 DIAGNOSIS — R079 Chest pain, unspecified: Secondary | ICD-10-CM | POA: Diagnosis not present

## 2018-06-19 DIAGNOSIS — R0602 Shortness of breath: Secondary | ICD-10-CM | POA: Diagnosis not present

## 2018-06-19 DIAGNOSIS — R9439 Abnormal result of other cardiovascular function study: Secondary | ICD-10-CM

## 2018-06-19 HISTORY — PX: LEFT HEART CATH AND CORONARY ANGIOGRAPHY: CATH118249

## 2018-06-19 LAB — BASIC METABOLIC PANEL
Anion gap: 6 (ref 5–15)
BUN: 18 mg/dL (ref 6–20)
CO2: 21 mmol/L — ABNORMAL LOW (ref 22–32)
Calcium: 8.6 mg/dL — ABNORMAL LOW (ref 8.9–10.3)
Chloride: 111 mmol/L (ref 98–111)
Creatinine, Ser: 1.22 mg/dL (ref 0.61–1.24)
GFR calc Af Amer: >60 mL/min (ref >60)
GFR calc non Af Amer: >60 mL/min (ref >60)
Glucose, Bld: 95 mg/dL (ref 70–99)
Potassium: 3.8 mmol/L (ref 3.5–5.1)
Sodium: 138 mmol/L (ref 135–145)

## 2018-06-19 LAB — HIV ANTIBODY (ROUTINE TESTING W REFLEX): HIV Screen 4th Generation wRfx: NONREACTIVE

## 2018-06-19 LAB — ECHOCARDIOGRAM COMPLETE
Height: 73 in
Weight: 3240 oz

## 2018-06-19 SURGERY — LEFT HEART CATH AND CORONARY ANGIOGRAPHY
Anesthesia: LOCAL

## 2018-06-19 MED ORDER — SODIUM CHLORIDE 0.9% FLUSH
3.0000 mL | Freq: Two times a day (BID) | INTRAVENOUS | Status: DC
  Administered 2018-06-19: 3 mL via INTRAVENOUS

## 2018-06-19 MED ORDER — LIDOCAINE HCL (PF) 1 % IJ SOLN
INTRAMUSCULAR | Status: DC | PRN
  Administered 2018-06-19: 2 mL

## 2018-06-19 MED ORDER — HEPARIN (PORCINE) IN NACL 1000-0.9 UT/500ML-% IV SOLN
INTRAVENOUS | Status: AC
  Filled 2018-06-19: qty 1000

## 2018-06-19 MED ORDER — ACETAMINOPHEN 325 MG PO TABS: 650.0000 mg | ORAL_TABLET | ORAL | Status: DC | PRN

## 2018-06-19 MED ORDER — IOHEXOL 350 MG/ML SOLN
INTRAVENOUS | Status: DC | PRN
  Administered 2018-06-19: 35 mL via INTRA_ARTERIAL

## 2018-06-19 MED ORDER — MIDAZOLAM HCL 2 MG/2ML IJ SOLN
INTRAMUSCULAR | Status: AC
  Filled 2018-06-19: qty 2

## 2018-06-19 MED ORDER — VERAPAMIL HCL 2.5 MG/ML IV SOLN
INTRAVENOUS | Status: AC
  Filled 2018-06-19: qty 2

## 2018-06-19 MED ORDER — SODIUM CHLORIDE 0.9 % IV SOLN: 250.0000 mL | INTRAVENOUS | Status: DC | PRN

## 2018-06-19 MED ORDER — ONDANSETRON HCL 4 MG/2ML IJ SOLN: 4.0000 mg | Freq: Four times a day (QID) | INTRAMUSCULAR | Status: DC | PRN

## 2018-06-19 MED ORDER — NITROGLYCERIN 1 MG/10 ML FOR IR/CATH LAB
INTRA_ARTERIAL | Status: AC
  Filled 2018-06-19: qty 10

## 2018-06-19 MED ORDER — FENTANYL CITRATE (PF) 100 MCG/2ML IJ SOLN
INTRAMUSCULAR | Status: DC | PRN
  Administered 2018-06-19: 25 ug via INTRAVENOUS

## 2018-06-19 MED ORDER — HEPARIN (PORCINE) IN NACL 1000-0.9 UT/500ML-% IV SOLN
INTRAVENOUS | Status: DC | PRN
  Administered 2018-06-19: 500 mL

## 2018-06-19 MED ORDER — MORPHINE SULFATE (PF) 2 MG/ML IV SOLN: 2.0000 mg | INTRAVENOUS | Status: DC | PRN

## 2018-06-19 MED ORDER — FENTANYL CITRATE (PF) 100 MCG/2ML IJ SOLN
INTRAMUSCULAR | Status: AC
  Filled 2018-06-19: qty 2

## 2018-06-19 MED ORDER — MIDAZOLAM HCL 2 MG/2ML IJ SOLN
INTRAMUSCULAR | Status: DC | PRN
  Administered 2018-06-19: 1 mg via INTRAVENOUS

## 2018-06-19 MED ORDER — SODIUM CHLORIDE 0.9 % IV SOLN: INTRAVENOUS | Status: AC

## 2018-06-19 MED ORDER — HEPARIN SODIUM (PORCINE) 1000 UNIT/ML IJ SOLN
INTRAMUSCULAR | Status: DC | PRN
  Administered 2018-06-19: 4500 [IU] via INTRAVENOUS

## 2018-06-19 MED ORDER — LIDOCAINE HCL (PF) 1 % IJ SOLN
INTRAMUSCULAR | Status: AC
  Filled 2018-06-19: qty 30

## 2018-06-19 MED ORDER — VERAPAMIL HCL 2.5 MG/ML IV SOLN
INTRAVENOUS | Status: DC | PRN
  Administered 2018-06-19: 15:00:00 via INTRA_ARTERIAL

## 2018-06-19 MED ORDER — SODIUM CHLORIDE 0.9% FLUSH: 3.0000 mL | INTRAVENOUS | Status: DC | PRN

## 2018-06-19 SURGICAL SUPPLY — 9 items
CATH INFINITI 5FR ANG PIGTAIL (CATHETERS) ×1 IMPLANT
CATH OPTITORQUE TIG 4.0 5F (CATHETERS) ×1 IMPLANT
DEVICE RAD COMP TR BAND LRG (VASCULAR PRODUCTS) ×1 IMPLANT
GLIDESHEATH SLEND A-KIT 6F 22G (SHEATH) ×1 IMPLANT
KIT HEART LEFT (KITS) ×2 IMPLANT
PACK CARDIAC CATHETERIZATION (CUSTOM PROCEDURE TRAY) ×2 IMPLANT
TRANSDUCER W/STOPCOCK (MISCELLANEOUS) ×2 IMPLANT
TUBING CIL FLEX 10 FLL-RA (TUBING) ×2 IMPLANT
WIRE HI TORQ VERSACORE-J 145CM (WIRE) ×1 IMPLANT

## 2018-06-19 NOTE — Progress Notes (Signed)
  Echocardiogram 2D Echocardiogram has been performed.  Darlina Sicilian M 06/19/2018, 8:32 AM

## 2018-06-19 NOTE — Progress Notes (Signed)
[facility] BAND REMOVAL  LOCATION:    Right radial  DEFLATED PER PROTOCOL:    Yes.    TIME BAND OFF / DRESSING APPLIED:  1730p   Gauze and tegaderm with coban    SITE UPON ARRIVAL:    Level 0  SITE AFTER BAND REMOVAL:    Level 0  CIRCULATION SENSATION AND MOVEMENT:    Within Normal Limits   Yes.    COMMENTS:   No complaint of any discomfort at site

## 2018-06-19 NOTE — Progress Notes (Signed)
PROGRESS NOTE    Justin English  KDX:[id number] DOB: 10-02-1955 DOA: 06/17/2018 PCP: Everardo Beals, NP    Brief Narrative:62 y.o. male with history of asthma, GERD who has had a EGD and colonoscopy on May 23 2018 about a month ago for symptoms of GERD which showed small hiatal hernia but otherwise unremarkable and was discharged home on Protonix 40 twice daily which patient has been taking daily comes to the ER because of chest discomfort.  Patient states he has long standing symptoms of GERD but last 1 month it has acutely worsened.  Prompting to have EGD done.  But despite taking the Protonix patient symptoms still persist and patient's office nursing practitioner advised to come to the ER to rule out cardiac etiologies.  Patient is chest discomfort happens even at rest and states it gets better on walking.  It is a sensation of fullness at times getting better with burping.  Denies nausea vomiting or diarrhea.  Has some associated shortness of breath.  No fever or chills.  ED Course: In the ER EKG shows normal sinus rhythm troponin negative.  CAT scan of the chest done with contrast shows calcification of the LAD and on-call cardiologist was consulted at this time the requested admission for further observation.  Assessment & Plan:   Principal Problem:   Chest pain Active Problems:   Gastroesophageal reflux disease   #1 chest discomfort patient really does not complain of chest pain as such.  GI work-up and treatment as an outpatient did not help his chest discomfort.  Myoview done yesterday was abnormal so patient scheduled for cardiac catheterization today.  And CT of the chest chest showed extensive calcification in LAD.cath today.    Estimated body mass index is 26.72 kg/m as calculated from the following:   Height as of this encounter: 6\' 1"  (1.854 m).   Weight as of this encounter: 91.9 kg.  DVT prophylaxis: lovenox Code Status:full Family Communication:  none Disposition Plan: pending cath Consultants: cardiology  Procedures: stress test  Antimicrobials: none  Subjective: No new complaints  Objective: Vitals:   06/18/18 2117 06/19/18 0248 06/19/18 0605 06/19/18 0658  BP: 132/83 109/83 113/79   Pulse: 79 78 70   Resp: 20 20 20    Temp: 97.8 F (36.6 C) 97.6 F (36.4 C) 97.9 F (36.6 C)   TempSrc: Oral Oral Oral   SpO2: 96% 100% 97%   Weight:    91.9 kg  Height:        Intake/Output Summary (Last 24 hours) at 06/19/2018 1239 Last data filed at 06/19/2018 0700 Gross per 24 hour  Intake 2590.57 ml  Output -  Net 2590.57 ml   Filed Weights   06/17/18 1929 06/18/18 0158 06/19/18 0658  Weight: 90.7 kg 91.2 kg 91.9 kg    Examination:  General exam: Appears calm and comfortable  Respiratory system: Clear to auscultation. Respiratory effort normal. Cardiovascular system: S1 & S2 heard, RRR. No JVD, murmurs, rubs, gallops or clicks. No pedal edema. Gastrointestinal system: Abdomen is nondistended, soft and nontender. No organomegaly or masses felt. Normal bowel sounds heard. Central nervous system: Alert and oriented. No focal neurological deficits. Extremities: Symmetric 5 x 5 power. Skin: No rashes, lesions or ulcers Psychiatry: Judgement and insight appear normal. Mood & affect appropriate.     Data Reviewed: I have personally reviewed following labs and imaging studies  CBC: Recent Labs  Lab 06/17/18 2145 06/18/18 0527  WBC 10.1 7.3  NEUTROABS 5.7  --  HGB 16.4 15.0  HCT 49.3 46.1  MCV 93.9 94.1  PLT 322 338   Basic Metabolic Panel: Recent Labs  Lab 06/17/18 2145 06/18/18 0527 06/19/18 0936  NA 140  --  138  K 3.9  --  3.8  CL 105  --  111  CO2 27  --  21*  GLUCOSE 104*  --  95  BUN 22*  --  18  CREATININE 1.36* 1.40* 1.22  CALCIUM 9.9  --  8.6*   GFR: Estimated Creatinine Clearance: 72.8 mL/min (by C-G formula based on SCr of 1.22 mg/dL). Liver Function Tests: Recent Labs  Lab  06/17/18 2145  AST 18  ALT 23  ALKPHOS 70  BILITOT 0.7  PROT 8.2*  ALBUMIN 4.7   No results for input(s): LIPASE, AMYLASE in the last 168 hours. No results for input(s): AMMONIA in the last 168 hours. Coagulation Profile: No results for input(s): INR, PROTIME in the last 168 hours. Cardiac Enzymes: Recent Labs  Lab 06/18/18 0527 06/18/18 1419 06/18/18 2051  TROPONINI <0.03 <0.03 <0.03   BNP (last 3 results) No results for input(s): PROBNP in the last 8760 hours. HbA1C: No results for input(s): HGBA1C in the last 72 hours. CBG: No results for input(s): GLUCAP in the last 168 hours. Lipid Profile: Recent Labs    06/18/18 0527  CHOL 149  HDL 30*  LDLCALC 102*  TRIG 84  CHOLHDL 5.0   Thyroid Function Tests: No results for input(s): TSH, T4TOTAL, FREET4, T3FREE, THYROIDAB in the last 72 hours. Anemia Panel: No results for input(s): VITAMINB12, FOLATE, FERRITIN, TIBC, IRON, RETICCTPCT in the last 72 hours. Sepsis Labs: No results for input(s): PROCALCITON, LATICACIDVEN in the last 168 hours.  No results found for this or any previous visit (from the past 240 hour(s)).       Radiology Studies: Dg Chest 2 View  Result Date: 06/17/2018 CLINICAL DATA:  Pt reports that he has been having non-stop burping pt states it has been making him feel SOB, and some Dizziness. Pt has hx of acid reflux worsening the past week. Pt HX: asthma, ex smokerChest discomfort EXAM: CHEST - 2 VIEW COMPARISON:  08/15/2016 FINDINGS: Normal mediastinum and cardiac silhouette. Normal pulmonary vasculature. No evidence of effusion, infiltrate, or pneumothorax. No acute bony abnormality. IMPRESSION: No acute cardiopulmonary process. Electronically Signed   By: Suzy Bouchard M.D.   On: 06/17/2018 20:43   Ct Chest W Contrast  Result Date: 06/17/2018 CLINICAL DATA:  Initial evaluation for acute shortness of breath. History of reflux disease. EXAM: CT CHEST WITH CONTRAST TECHNIQUE: Multidetector CT  imaging of the chest was performed during intravenous contrast administration. CONTRAST:  140mL ISOVUE-300 IOPAMIDOL (ISOVUE-300) INJECTION 61% COMPARISON:  Prior radiograph from earlier the same day. FINDINGS: Cardiovascular: Intrathoracic aorta normal in caliber without aneurysm or other acute finding. Mild scattered atheromatous plaque within the aortic arch. Visualized great vessels within normal limits. Heart size normal. No pericardial effusion. Prominent coronary artery calcifications noted within the LAD. Limited evaluation of the pulmonary arterial tree grossly unremarkable. Mediastinum/Nodes: Visualized thyroid within normal limits. No pathologically enlarged mediastinal, hilar or axillary lymph nodes. Esophagus within normal limits. Small hiatal hernia noted. Lungs/Pleura: Tracheal air column intact and widely patent. Lungs well inflated bilaterally. Minimal subsegmental atelectatic changes seen dependently within the lower lobes bilaterally. Mild centrilobular emphysema at the bilateral lung apices. No focal infiltrates. No pulmonary edema or pleural effusion. No pneumothorax. No worrisome pulmonary nodule or mass. Upper Abdomen: Subcentimeter hypodensity within left hepatic lobe  noted, too small the characterize, but may reflect a small cyst. Visualized upper abdomen otherwise unremarkable. Musculoskeletal: External soft tissues demonstrate no acute finding. No acute osseous abnormality. No discrete lytic or blastic osseous lesions. IMPRESSION: 1. No CT evidence for acute cardiopulmonary abnormality. 2. Mild upper lobe centrilobular emphysema. 3. Small hiatal hernia. 4. Mild aortic atherosclerosis with prominent coronary artery calcifications within the LAD. Electronically Signed   By: Jeannine Boga M.D.   On: 06/17/2018 23:31   Nm Myocar Multi W/spect W/wall Motion / Ef  Result Date: 06/18/2018  Inferior, inferolateral, inferoseptal thinning with decreased tracer activity at base consistent  with probable soft tissue attenuation (diaphragm, bowel activity); cannot completely exclude subendocardial scar. No ischemia  Nuclear stress EF: 48%.  This is a low risk study.         Scheduled Meds: . aspirin EC  325 mg Oral Daily  . enoxaparin (LOVENOX) injection  40 mg Subcutaneous Q24H  . nitroGLYCERIN  1 inch Topical Q6H  . pantoprazole  40 mg Oral BID  . sodium chloride flush  3 mL Intravenous Q12H  . traZODone  50 mg Oral QHS   Continuous Infusions: . sodium chloride 100 mL/hr at 06/19/18 0835  . sodium chloride    . sodium chloride 1 mL/kg/hr (06/19/18 0507)     LOS: 0 days     Georgette Shell, MD [facility]  If 7PM-7AM, please contact night-coverage [email or web address] Password [facility] 06/19/2018, 12:39 PM

## 2018-06-19 NOTE — Progress Notes (Addendum)
Progress Note  Patient Name: Justin English Date of Encounter: 06/19/2018  Primary Cardiologist: Mertie Moores, MD   Subjective   No chest pain or shortness of breath through the night. He slept fairly well. He is nervous about cath. Discussed procedure and answered questions.   Inpatient Medications    Scheduled Meds: . aspirin EC  325 mg Oral Daily  . enoxaparin (LOVENOX) injection  40 mg Subcutaneous Q24H  . nitroGLYCERIN  1 inch Topical Q6H  . pantoprazole  40 mg Oral BID  . sodium chloride flush  3 mL Intravenous Q12H  . traZODone  50 mg Oral QHS   Continuous Infusions: . sodium chloride Stopped (06/19/18 0401)  . sodium chloride    . sodium chloride 1 mL/kg/hr (06/19/18 0507)   PRN Meds: sodium chloride, acetaminophen, albuterol, busPIRone, hydrALAZINE, ondansetron (ZOFRAN) IV, sodium chloride flush   Vital Signs    Vitals:   06/18/18 2117 06/19/18 0248 06/19/18 0605 06/19/18 0658  BP: 132/83 109/83 113/79   Pulse: 79 78 70   Resp: 20 20 20    Temp: 97.8 F (36.6 C) 97.6 F (36.4 C) 97.9 F (36.6 C)   TempSrc: Oral Oral Oral   SpO2: 96% 100% 97%   Weight:    91.9 kg  Height:        Intake/Output Summary (Last 24 hours) at 06/19/2018 0758 Last data filed at 06/19/2018 0700 Gross per 24 hour  Intake 2590.57 ml  Output -  Net 2590.57 ml   Last 3 Weights 06/19/2018 06/18/2018 06/17/2018  Weight (lbs) 202 lb 8 oz 201 lb 200 lb  Weight (kg) 91.853 kg 91.173 kg 90.719 kg      Telemetry    Sinus rhythm - Personally Reviewed  ECG    No new tracings - Personally Reviewed  Physical Exam   GEN: No acute distress.   Neck: No JVD Cardiac: RRR, no murmurs, rubs, or gallops.  Respiratory: Clear to auscultation bilaterally. GI: Soft, nontender, non-distended  MS: No edema; No deformity. Neuro:  Nonfocal  Psych: Normal affect   Labs    Chemistry Recent Labs  Lab 06/17/18 2145 06/18/18 0527  NA 140  --   K 3.9  --   CL 105  --   CO2 27  --     GLUCOSE 104*  --   BUN 22*  --   CREATININE 1.36* 1.40*  CALCIUM 9.9  --   PROT 8.2*  --   ALBUMIN 4.7  --   AST 18  --   ALT 23  --   ALKPHOS 70  --   BILITOT 0.7  --   GFRNONAA 56* 54*  GFRAA >60 >60  ANIONGAP 8  --      Hematology Recent Labs  Lab 06/17/18 2145 06/18/18 0527  WBC 10.1 7.3  RBC 5.25 4.90  HGB 16.4 15.0  HCT 49.3 46.1  MCV 93.9 94.1  MCH 31.2 30.6  MCHC 33.3 32.5  RDW 12.0 12.1  PLT 322 289    Cardiac Enzymes Recent Labs  Lab 06/18/18 0527 06/18/18 1419 06/18/18 2051  TROPONINI <0.03 <0.03 <0.03    Recent Labs  Lab 06/17/18 2153  TROPIPOC 0.00     BNPNo results for input(s): BNP, PROBNP in the last 168 hours.   DDimer No results for input(s): DDIMER in the last 168 hours.   Radiology    Dg Chest 2 View  Result Date: 06/17/2018 CLINICAL DATA:  Pt reports that he has been having  non-stop burping pt states it has been making him feel SOB, and some Dizziness. Pt has hx of acid reflux worsening the past week. Pt HX: asthma, ex smokerChest discomfort EXAM: CHEST - 2 VIEW COMPARISON:  08/15/2016 FINDINGS: Normal mediastinum and cardiac silhouette. Normal pulmonary vasculature. No evidence of effusion, infiltrate, or pneumothorax. No acute bony abnormality. IMPRESSION: No acute cardiopulmonary process. Electronically Signed   By: Suzy Bouchard M.D.   On: 06/17/2018 20:43   Ct Chest W Contrast  Result Date: 06/17/2018 CLINICAL DATA:  Initial evaluation for acute shortness of breath. History of reflux disease. EXAM: CT CHEST WITH CONTRAST TECHNIQUE: Multidetector CT imaging of the chest was performed during intravenous contrast administration. CONTRAST:  158mL ISOVUE-300 IOPAMIDOL (ISOVUE-300) INJECTION 61% COMPARISON:  Prior radiograph from earlier the same day. FINDINGS: Cardiovascular: Intrathoracic aorta normal in caliber without aneurysm or other acute finding. Mild scattered atheromatous plaque within the aortic arch. Visualized great  vessels within normal limits. Heart size normal. No pericardial effusion. Prominent coronary artery calcifications noted within the LAD. Limited evaluation of the pulmonary arterial tree grossly unremarkable. Mediastinum/Nodes: Visualized thyroid within normal limits. No pathologically enlarged mediastinal, hilar or axillary lymph nodes. Esophagus within normal limits. Small hiatal hernia noted. Lungs/Pleura: Tracheal air column intact and widely patent. Lungs well inflated bilaterally. Minimal subsegmental atelectatic changes seen dependently within the lower lobes bilaterally. Mild centrilobular emphysema at the bilateral lung apices. No focal infiltrates. No pulmonary edema or pleural effusion. No pneumothorax. No worrisome pulmonary nodule or mass. Upper Abdomen: Subcentimeter hypodensity within left hepatic lobe noted, too small the characterize, but may reflect a small cyst. Visualized upper abdomen otherwise unremarkable. Musculoskeletal: External soft tissues demonstrate no acute finding. No acute osseous abnormality. No discrete lytic or blastic osseous lesions. IMPRESSION: 1. No CT evidence for acute cardiopulmonary abnormality. 2. Mild upper lobe centrilobular emphysema. 3. Small hiatal hernia. 4. Mild aortic atherosclerosis with prominent coronary artery calcifications within the LAD. Electronically Signed   By: Jeannine Boga M.D.   On: 06/17/2018 23:31   Nm Myocar Multi W/spect W/wall Motion / Ef  Result Date: 06/18/2018  Inferior, inferolateral, inferoseptal thinning with decreased tracer activity at base consistent with probable soft tissue attenuation (diaphragm, bowel activity); cannot completely exclude subendocardial scar. No ischemia  Nuclear stress EF: 48%.  This is a low risk study.     Cardiac Studies   Myoview 06/18/18  Inferior, inferolateral, inferoseptal thinning with decreased tracer activity at base consistent with probable soft tissue attenuation (diaphragm, bowel  activity); cannot completely exclude subendocardial scar. No ischemia  Nuclear stress EF: 48%.  This is a low risk study.  Patient Profile     63 y.o. male with a hx of asthma, gastroesophageal reflux and hiatal hernia who is being seen today for the evaluation of chest pain at the request of Dr. Rodena Piety. Ruled out for MI.   Assessment & Plan    1. Chest discomfort: Pt presented with several weeks of chest discomfort. CT chest showed significant calcifications in LAD distribution. Myoview with inferior wall attenuation in a non-obese person, could be ischemia. He is scheduled for cardiac cath today to definitively evaluate coronary arteries. Procedure discussed and questions answered. The patient understands that risks included but are not limited to stroke (1 in 1000), death (1 in 12), kidney failure [usually temporary] (1 in 500), bleeding (1 in 200), allergic reaction [possibly serious] (1 in 200).   2. Lipid panel shows TC 149, LDL 102. If CAD found on cath, pt would  need statin therapy with LDL goal of <70.   3. Prior tobacco use: Pt quit smoking 3 years ago. Congratulated. Strongly encouraged to continue cessation.   For questions or updates, please contact [place] Please consult [email or web address] for contact info under        Signed, Daune Perch, NP  06/19/2018, 7:58 AM    Attending Note:   The patient was seen and examined.  Agree with assessment and plan as noted above.  Changes made to the above note as needed.  Patient seen and independently examined with Pecolia Ades, NP .   We discussed all aspects of the encounter. I agree with the assessment and plan as stated above.  1.  Chest discomfort: His episodes of chest discomfort are somewhat worrisome.  Stress Myoview study revealed a mildly reduced left ventricular systolic function.  He had inferior scar.  He is fairly thin and I would not ordinarily think that he would have an inferior scar given his body  habitus.  I discussed the risks, benefits, options of heart catheterization.  He understands and agrees to proceed.   I have spent a total of 40 minutes with patient reviewing hospital  notes , telemetry, EKGs, labs and examining patient as well as establishing an assessment and plan that was discussed with the patient. > 50% of time was spent in direct patient care.    Thayer Headings, Brooke Bonito., MD, [facility] 06/19/2018, 12:16 PM [address]  [place] Pager [phone]

## 2018-06-19 NOTE — Interval H&P Note (Signed)
Cath Lab Visit (complete for each Cath Lab visit)  Clinical Evaluation Leading to the Procedure:   ACS: Yes.    Non-ACS:    Anginal Classification: CCS III  Anti-ischemic medical therapy: No Therapy  Non-Invasive Test Results: Low-risk stress test findings: cardiac mortality <1%/year  Prior CABG: No previous CABG      History and Physical Interval Note:  06/19/2018 2:40 PM  Justin English  has presented today for surgery, with the diagnosis of unstable angina  The various methods of treatment have been discussed with the patient and family. After consideration of risks, benefits and other options for treatment, the patient has consented to  Procedure(s): LEFT HEART CATH AND CORONARY ANGIOGRAPHY (N/A) as a surgical intervention .  The patient's history has been reviewed, patient examined, no change in status, stable for surgery.  I have reviewed the patient's chart and labs.  Questions were answered to the patient's satisfaction.     [name]

## 2018-06-20 ENCOUNTER — Encounter (HOSPITAL_COMMUNITY): Payer: Self-pay | Admitting: Cardiovascular Disease

## 2018-06-20 DIAGNOSIS — R142 Eructation: Secondary | ICD-10-CM

## 2018-06-20 DIAGNOSIS — R030 Elevated blood-pressure reading, without diagnosis of hypertension: Secondary | ICD-10-CM

## 2018-06-20 DIAGNOSIS — R079 Chest pain, unspecified: Secondary | ICD-10-CM | POA: Diagnosis not present

## 2018-06-20 LAB — CBC
HCT: 43.5 % (ref 39.0–52.0)
HEMOGLOBIN: 14.3 g/dL (ref 13.0–17.0)
MCH: 31.2 pg (ref 26.0–34.0)
MCHC: 32.9 g/dL (ref 30.0–36.0)
MCV: 94.8 fL (ref 80.0–100.0)
Platelets: 257 10*3/uL (ref 150–400)
RBC: 4.59 MIL/uL (ref 4.22–5.81)
RDW: 12 % (ref 11.5–15.5)
WBC: 7 10*3/uL (ref 4.0–10.5)
nRBC: 0 % (ref 0.0–0.2)

## 2018-06-20 LAB — BASIC METABOLIC PANEL
Anion gap: 6 (ref 5–15)
BUN: 15 mg/dL (ref 6–20)
CO2: 22 mmol/L (ref 22–32)
CREATININE: 1.18 mg/dL (ref 0.61–1.24)
Calcium: 8.4 mg/dL — ABNORMAL LOW (ref 8.9–10.3)
Chloride: 110 mmol/L (ref 98–111)
GFR calc Af Amer: >60 mL/min (ref >60)
GFR calc non Af Amer: >60 mL/min (ref >60)
Glucose, Bld: 95 mg/dL (ref 70–99)
Potassium: 3.8 mmol/L (ref 3.5–5.1)
Sodium: 138 mmol/L (ref 135–145)

## 2018-06-20 MED ORDER — METOPROLOL TARTRATE 25 MG PO TABS: 12.5000 mg | ORAL_TABLET | Freq: Every day | ORAL | 11 refills | Status: DC

## 2018-06-20 MED ORDER — SUCRALFATE 1 G PO TABS: 1.0000 g | ORAL_TABLET | Freq: Three times a day (TID) | ORAL | 0 refills | Status: DC

## 2018-06-20 MED FILL — Nitroglycerin IV Soln 100 MCG/ML in D5W: INTRA_ARTERIAL | Qty: 10 | Status: AC

## 2018-06-20 NOTE — Plan of Care (Signed)
  Problem: Cardiovascular: Goal: Vascular access site(s) Level 0-1 will be maintained Outcome: Progressing   Problem: Health Behavior/Discharge Planning: Goal: Ability to manage health-related needs will improve Outcome: Completed/Met   Problem: Clinical Measurements: Goal: Ability to maintain clinical measurements within normal limits will improve Outcome: Completed/Met Goal: Will remain free from infection Outcome: Completed/Met Goal: Diagnostic test results will improve Outcome: Completed/Met Goal: Respiratory complications will improve Outcome: Completed/Met Goal: Cardiovascular complication will be avoided Outcome: Completed/Met   Problem: Activity: Goal: Risk for activity intolerance will decrease Outcome: Completed/Met   Problem: Nutrition: Goal: Adequate nutrition will be maintained Outcome: Completed/Met   Problem: Coping: Goal: Level of anxiety will decrease Outcome: Completed/Met   Problem: Elimination: Goal: Will not experience complications related to bowel motility Outcome: Completed/Met Goal: Will not experience complications related to urinary retention Outcome: Completed/Met   Problem: Pain Managment: Goal: General experience of comfort will improve Outcome: Completed/Met   Problem: Safety: Goal: Ability to remain free from injury will improve Outcome: Completed/Met   Problem: Skin Integrity: Goal: Risk for impaired skin integrity will decrease Outcome: Completed/Met   Problem: Activity: Goal: Ability to return to baseline activity level will improve Outcome: Completed/Met   Problem: Cardiovascular: Goal: Ability to achieve and maintain adequate cardiovascular perfusion will improve Outcome: Completed/Met

## 2018-06-20 NOTE — Care Management Note (Signed)
Case Management Note  Patient Details  Name: Justin English MRN: [id number] Date of Birth: July 14, 1955  Subjective/Objective:No CM needs.                    Action/Plan:d/c home.   Expected Discharge Date:  06/20/18               Expected Discharge Plan:  Home/Self Care  In-House Referral:     Discharge planning Services  CM Consult  Post Acute Care Choice:    Choice offered to:     DME Arranged:    DME Agency:     HH Arranged:    HH Agency:     Status of Service:  Completed, signed off  If discussed at [employer] of Stay Meetings, dates discussed:    Additional Comments:  Dessa Phi, RN 06/20/2018, 10:52 AM

## 2018-06-20 NOTE — Discharge Summary (Signed)
Physician Discharge Summary  SMOKEY MELOTT MEQ:[id number] DOB: 05/10/1956 DOA: 06/17/2018  PCP: Everardo Beals, NP  Admit date: 06/17/2018 Discharge date: 06/20/2018  Admitted From: Home Disposition: Home Recommendations for Outpatient Follow-up:  1. Follow up with PCP in 1-2 weeks 2. Please obtain BMP/CBC in one week 3. Follow-up with cardiology 4. Follow-up with GI  Home Health none Equipment/Devices none Discharge Condition: Stable CODE STATUS full code Diet recommendation: Cardiac Brief/Interim Summary:62 y.o.malewithhistory of asthma, GERD who has had a EGD and colonoscopy on May 23 2018 about a month ago for symptoms of GERD which showed small hiatal hernia but otherwise unremarkable and was discharged home on Protonix 40 twice daily which patient has been taking daily comes to the ER because of chest discomfort. Patient states he has long standing symptoms of GERD but last 1 month it has acutely worsened. Prompting to have EGD done. But despite taking the Protonix patient symptoms still persist and patient's office nursing practitioner advised to come to the ER to rule out cardiac etiologies. Patient is chest discomfort happens even at rest and states it gets better on walking. It is a sensation of fullness at times getting better with burping. Denies nausea vomiting or diarrhea. Has some associated shortness of breath. No fever or chills.  ED Course:In the ER EKG shows normal sinus rhythm troponin negative. CAT scan of the chest done with contrast shows calcification of the LAD and on-call cardiologist was consulted at this time the requested admission for further observation  Discharge Diagnoses:  Principal Problem:   Chest pain Active Problems:   Gastroesophageal reflux disease   Shortness of breath    #1 atypical chest discomfort patient admitted with complaints of belching and burping and chest discomfort.  Patient has been worked up as an outpatient  by GI and is being worked up as an outpatient by GI.  Patient was admitted to telemetry floor Myoview done was abnormal echocardiogram showed normal ejection fraction.  A cardiac cath was done 06/19/2018 which was clean and normal coronaries.  Patient continues to have this burping and belching and anxiety attacks.  Patient follows up with his PCP for anxiety and is on BuSpar which he thinks is not helping at all.  I have encouraged him to follow-up with his PCP to adjust the dose of BuSpar or to change to something else.  Reviewed cardiology notes.  I will discharge him on metoprolol 12.5 mg once a day in view of his soft blood pressure and normal heart rate.  Patient does report he has a lot of stress at work.  Continue with the sucralfate and Protonix.     Estimated body mass index is 26.72 kg/m as calculated from the following:   Height as of this encounter: 6\' 1"  (1.854 m).   Weight as of this encounter: 91.9 kg.  Discharge Instructions  Discharge Instructions    Ambulatory referral to Cardiology   Complete by:  As directed    Call MD for:  difficulty breathing, headache or visual disturbances   Complete by:  As directed    Call MD for:  persistant nausea and vomiting   Complete by:  As directed    Call MD for:  severe uncontrolled pain   Complete by:  As directed    Diet - low sodium heart healthy   Complete by:  As directed    Increase activity slowly   Complete by:  As directed      Allergies as of 06/20/2018  Reactions   Zithromax [azithromycin] Itching   ZPAC/ caused hives      Medication List    TAKE these medications   albuterol 108 (90 Base) MCG/ACT inhaler Commonly known as:  PROVENTIL HFA;VENTOLIN HFA Inhale 1-2 puffs into the lungs every 6 (six) hours as needed for wheezing or shortness of breath.   busPIRone 15 MG tablet Commonly known as:  BUSPAR Take 15 mg by mouth daily as needed (anxiety).   chlorpheniramine 4 MG tablet Commonly known as:   CHLOR-TRIMETON Take 4 mg by mouth as needed for allergies.   metoprolol tartrate 25 MG tablet Commonly known as:  LOPRESSOR Take 0.5 tablets (12.5 mg total) by mouth daily.   pantoprazole 40 MG tablet Commonly known as:  PROTONIX Take 40 mg by mouth 2 (two) times daily.   QVAR 80 MCG/ACT inhaler Generic drug:  beclomethasone Inhale 2 puffs into the lungs daily as needed (SOB, wheezing).   sucralfate 1 g tablet Commonly known as:  CARAFATE Take 1 tablet (1 g total) by mouth 4 (four) times daily -  with meals and at bedtime for 14 days.      Follow-up Information    [facility].   Specialty:  Cardiology Contact information: [address] [place]       Everardo Beals, NP. Schedule an appointment as soon as possible for a visit in 1 week.   Why:  You will need a blood pressure recheck. Contact information: [place] [address] [phone]        Mansouraty, Telford Nab., MD Follow up.   Specialties:  Gastroenterology, Internal Medicine Contact information: [place] [address] [phone]          Allergies  Allergen Reactions  . Zithromax [Azithromycin] Itching    ZPAC/ caused hives    Consultations:  Dr Acie Fredrickson   Procedures/Studies: Dg Chest 2 View  Result Date: 06/17/2018 CLINICAL DATA:  Pt reports that he has been having non-stop burping pt states it has been making him feel SOB, and some Dizziness. Pt has hx of acid reflux worsening the past week. Pt HX: asthma, ex smokerChest discomfort EXAM: CHEST - 2 VIEW COMPARISON:  08/15/2016 FINDINGS: Normal mediastinum and cardiac silhouette. Normal pulmonary vasculature. No evidence of effusion, infiltrate, or pneumothorax. No acute bony abnormality. IMPRESSION: No acute cardiopulmonary process. Electronically Signed   By: Suzy Bouchard M.D.   On: 06/17/2018 20:43   Ct Chest W  Contrast  Result Date: 06/17/2018 CLINICAL DATA:  Initial evaluation for acute shortness of breath. History of reflux disease. EXAM: CT CHEST WITH CONTRAST TECHNIQUE: Multidetector CT imaging of the chest was performed during intravenous contrast administration. CONTRAST:  164mL ISOVUE-300 IOPAMIDOL (ISOVUE-300) INJECTION 61% COMPARISON:  Prior radiograph from earlier the same day. FINDINGS: Cardiovascular: Intrathoracic aorta normal in caliber without aneurysm or other acute finding. Mild scattered atheromatous plaque within the aortic arch. Visualized great vessels within normal limits. Heart size normal. No pericardial effusion. Prominent coronary artery calcifications noted within the LAD. Limited evaluation of the pulmonary arterial tree grossly unremarkable. Mediastinum/Nodes: Visualized thyroid within normal limits. No pathologically enlarged mediastinal, hilar or axillary lymph nodes. Esophagus within normal limits. Small hiatal hernia noted. Lungs/Pleura: Tracheal air column intact and widely patent. Lungs well inflated bilaterally. Minimal subsegmental atelectatic changes seen dependently within the lower lobes bilaterally. Mild centrilobular emphysema at the bilateral lung apices. No focal infiltrates. No pulmonary edema or pleural  effusion. No pneumothorax. No worrisome pulmonary nodule or mass. Upper Abdomen: Subcentimeter hypodensity within left hepatic lobe noted, too small the characterize, but may reflect a small cyst. Visualized upper abdomen otherwise unremarkable. Musculoskeletal: External soft tissues demonstrate no acute finding. No acute osseous abnormality. No discrete lytic or blastic osseous lesions. IMPRESSION: 1. No CT evidence for acute cardiopulmonary abnormality. 2. Mild upper lobe centrilobular emphysema. 3. Small hiatal hernia. 4. Mild aortic atherosclerosis with prominent coronary artery calcifications within the LAD. Electronically Signed   By: Jeannine Boga M.D.   On:  06/17/2018 23:31   Nm Myocar Multi W/spect W/wall Motion / Ef  Result Date: 06/18/2018  Inferior, inferolateral, inferoseptal thinning with decreased tracer activity at base consistent with probable soft tissue attenuation (diaphragm, bowel activity); cannot completely exclude subendocardial scar. No ischemia  Nuclear stress EF: 48%.  This is a low risk study.     (Echo, Carotid, EGD, Colonoscopy, ERCP)    Subjective:   Discharge Exam: Vitals:   06/19/18 2111 06/20/18 0644  BP: 122/84 113/82  Pulse: 80 70  Resp: 16 16  Temp: 97.8 F (36.6 C) 98 F (36.7 C)  SpO2: 96% 100%   Vitals:   06/19/18 1725 06/19/18 1755 06/19/18 2111 06/20/18 0644  BP: (!) 145/82 137/82 122/84 113/82  Pulse: 86 71 80 70  Resp: 11 11 16 16   Temp:   97.8 F (36.6 C) 98 F (36.7 C)  TempSrc:   Oral Oral  SpO2: 98% 98% 96% 100%  Weight:      Height:        General: Pt is alert, awake, not in acute distress Cardiovascular: RRR, S1/S2 +, no rubs, no gallops Respiratory: CTA bilaterally, no wheezing, no rhonchi Abdominal: Soft, NT, ND, bowel sounds + Extremities: no edema, no cyanosis    The results of significant diagnostics from this hospitalization (including imaging, microbiology, ancillary and laboratory) are listed below for reference.     Microbiology: No results found for this or any previous visit (from the past 240 hour(s)).   Labs: BNP (last 3 results) No results for input(s): BNP in the last 8760 hours. Basic Metabolic Panel: Recent Labs  Lab 06/17/18 2145 06/18/18 0527 06/19/18 0936 06/20/18 0457  NA 140  --  138 138  K 3.9  --  3.8 3.8  CL 105  --  111 110  CO2 27  --  21* 22  GLUCOSE 104*  --  95 95  BUN 22*  --  18 15  CREATININE 1.36* 1.40* 1.22 1.18  CALCIUM 9.9  --  8.6* 8.4*   Liver Function Tests: Recent Labs  Lab 06/17/18 2145  AST 18  ALT 23  ALKPHOS 70  BILITOT 0.7  PROT 8.2*  ALBUMIN 4.7   No results for input(s): LIPASE, AMYLASE in the  last 168 hours. No results for input(s): AMMONIA in the last 168 hours. CBC: Recent Labs  Lab 06/17/18 2145 06/18/18 0527 06/20/18 0457  WBC 10.1 7.3 7.0  NEUTROABS 5.7  --   --   HGB 16.4 15.0 14.3  HCT 49.3 46.1 43.5  MCV 93.9 94.1 94.8  PLT 322 289 257   Cardiac Enzymes: Recent Labs  Lab 06/18/18 0527 06/18/18 1419 06/18/18 2051  TROPONINI <0.03 <0.03 <0.03   BNP: Invalid input(s): POCBNP CBG: No results for input(s): GLUCAP in the last 168 hours. D-Dimer No results for input(s): DDIMER in the last 72 hours. Hgb A1c No results for input(s): HGBA1C in the last 72 hours.  Lipid Profile Recent Labs    06/18/18 0527  CHOL 149  HDL 30*  LDLCALC 102*  TRIG 84  CHOLHDL 5.0   Thyroid function studies No results for input(s): TSH, T4TOTAL, T3FREE, THYROIDAB in the last 72 hours.  Invalid input(s): FREET3 Anemia work up No results for input(s): VITAMINB12, FOLATE, FERRITIN, TIBC, IRON, RETICCTPCT in the last 72 hours. Urinalysis    Component Value Date/Time   COLORURINE COLORLESS (A) 08/05/2016 1236   APPEARANCEUR CLEAR 08/05/2016 1236   LABSPEC 1.001 (L) 08/05/2016 1236   PHURINE 8.0 08/05/2016 1236   GLUCOSEU NEGATIVE 08/05/2016 1236   HGBUR NEGATIVE 08/05/2016 1236   BILIRUBINUR NEGATIVE 08/05/2016 1236   KETONESUR NEGATIVE 08/05/2016 1236   PROTEINUR NEGATIVE 08/05/2016 1236   NITRITE NEGATIVE 08/05/2016 1236   LEUKOCYTESUR NEGATIVE 08/05/2016 1236   Sepsis Labs Invalid input(s): PROCALCITONIN,  WBC,  LACTICIDVEN Microbiology No results found for this or any previous visit (from the past 240 hour(s)).   Time coordinating discharge: 34  minutes  SIGNED:   Georgette Shell, MD  [facility] 06/20/2018, 10:17 AM Pager   If 7PM-7AM, please contact night-coverage [email or web address] Password [email or web address]

## 2018-06-20 NOTE — Progress Notes (Signed)
Progress Note  Patient Name: Justin English Date of Encounter: 06/20/2018  Primary Cardiologist: Mertie Moores, MD   Subjective   The patient denies any significant chest pain or shortness of breath.  He is frustrated that he is still belching and has significant anxiety despite being relieved that he has no blockages in his coronary arteries.  He complains that shortly after he got here he had an episode of increased anxiety and belching and thinks that he may have felt his heart racing at the time.  I reviewed his telemetry and he did have an episode of sinus tach in the 130s around that time.  Inpatient Medications    Scheduled Meds: . aspirin EC  325 mg Oral Daily  . nitroGLYCERIN  1 inch Topical Q6H  . pantoprazole  40 mg Oral BID  . sodium chloride flush  3 mL Intravenous Q12H  . traZODone  50 mg Oral QHS   Continuous Infusions: . sodium chloride 100 mL/hr at 06/20/18 0646  . sodium chloride     PRN Meds: sodium chloride, acetaminophen, albuterol, busPIRone, hydrALAZINE, morphine injection, ondansetron (ZOFRAN) IV, sodium chloride flush   Vital Signs    Vitals:   06/19/18 1725 06/19/18 1755 06/19/18 2111 06/20/18 0644  BP: (!) 145/82 137/82 122/84 113/82  Pulse: 86 71 80 70  Resp: 11 11 16 16   Temp:   97.8 F (36.6 C) 98 F (36.7 C)  TempSrc:   Oral Oral  SpO2: 98% 98% 96% 100%  Weight:      Height:        Intake/Output Summary (Last 24 hours) at 06/20/2018 0836 Last data filed at 06/20/2018 0300 Gross per 24 hour  Intake 1352.07 ml  Output -  Net 1352.07 ml   Last 3 Weights 06/19/2018 06/18/2018 06/17/2018  Weight (lbs) 202 lb 8 oz 201 lb 200 lb  Weight (kg) 91.853 kg 91.173 kg 90.719 kg      Telemetry    Sinus rhythm 60s-70s- Personally Reviewed  ECG    No new tracings- Personally Reviewed  Physical Exam   GEN: No acute distress.   Neck: No JVD Cardiac: RRR, no murmurs, rubs, or gallops.  Respiratory: Clear to auscultation bilaterally. GI:  Soft, nontender, non-distended  MS: No edema; No deformity. Neuro:  Nonfocal  Psych: Normal affect   Labs    Chemistry Recent Labs  Lab 06/17/18 2145 06/18/18 0527 06/19/18 0936 06/20/18 0457  NA 140  --  138 138  K 3.9  --  3.8 3.8  CL 105  --  111 110  CO2 27  --  21* 22  GLUCOSE 104*  --  95 95  BUN 22*  --  18 15  CREATININE 1.36* 1.40* 1.22 1.18  CALCIUM 9.9  --  8.6* 8.4*  PROT 8.2*  --   --   --   ALBUMIN 4.7  --   --   --   AST 18  --   --   --   ALT 23  --   --   --   ALKPHOS 70  --   --   --   BILITOT 0.7  --   --   --   GFRNONAA 56* 54* >60 >60  GFRAA >60 >60 >60 >60  ANIONGAP 8  --  6 6     Hematology Recent Labs  Lab 06/17/18 2145 06/18/18 0527 06/20/18 0457  WBC 10.1 7.3 7.0  RBC 5.25 4.90 4.59  HGB 16.4  15.0 14.3  HCT 49.3 46.1 43.5  MCV 93.9 94.1 94.8  MCH 31.2 30.6 31.2  MCHC 33.3 32.5 32.9  RDW 12.0 12.1 12.0  PLT 322 289 257    Cardiac Enzymes Recent Labs  Lab 06/18/18 0527 06/18/18 1419 06/18/18 2051  TROPONINI <0.03 <0.03 <0.03    Recent Labs  Lab 06/17/18 2153  TROPIPOC 0.00     BNPNo results for input(s): BNP, PROBNP in the last 168 hours.   DDimer No results for input(s): DDIMER in the last 168 hours.   Radiology    Nm Myocar Multi W/spect W/wall Motion / Ef  Result Date: 06/18/2018  Inferior, inferolateral, inferoseptal thinning with decreased tracer activity at base consistent with probable soft tissue attenuation (diaphragm, bowel activity); cannot completely exclude subendocardial scar. No ischemia  Nuclear stress EF: 48%.  This is a low risk study.     Cardiac Studies   Left Heart Cath 06/19/18 IMPRESSION: [name] has clean coronary arteries and normal filling pressures.  He has normal ejection fraction by 2D echo.  His symptoms are noncardiac.  The sheath was removed and a TR band was placed on the right wrist to achieve patent hemostasis.  The patient left the lab in stable condition.  [name].  MD, [facility]  Echocardiogram 06/19/2018 Study Conclusions - Left ventricle: The cavity size was normal. Wall thickness was   normal. Systolic function was normal. The estimated ejection   fraction was in the range of 60% to 65%. Wall motion was normal;   there were no regional wall motion abnormalities. Doppler   parameters are consistent with abnormal left ventricular   relaxation (grade 1 diastolic dysfunction). Normal strain   pattern. - Aortic valve: There was no stenosis. - Mitral valve: There was no regurgitation. - Right ventricle: The cavity size was normal. Systolic function   was normal. - Pulmonary arteries: No complete TR doppler jet so unable to   estimate PA systolic pressure. - Inferior vena cava: The vessel was normal in size. The   respirophasic diameter changes were in the normal range (>= 50%),   consistent with normal central venous pressure.  Impressions: - Normal LV size and systolic function, EF 72-53%. Normal RV size   and systolic function. No significant valvular abnormalities.   Patient Profile     63 y.o. male  with a hx of asthma, gastroesophageal refluxand hiatal hernia who is being seen today for the evaluation of chest painat the request of Dr. Rodena Piety.Ruled out for MI.   Assessment & Plan    1. Chest discomfort -Patient had concerning symptoms and findings on Myoview of potential wall attenuation that was concerning in a nonobese patient.  Therefore the patient was taken for cardiac catheterization yesterday and found to have normal coronary arteries and normal filling pressures. -Right radial cath site is stable -Echocardiogram showed normal LV systolic function with EF 60-65%, no regional wall motion abnormalities, grade 1 diastolic dysfunction, no significant valvular abnormalities. -Today the patient discusses his severe anxiety.  He can be walking around at his job and suddenly feel like his heart is racing and he is very anxious and  accompanied by belching.  He is being worked up by GI for the belching.  He had an episode of anxiety shortly after arrival to the hospital and upon review of his telemetry and noted an episode of heart rates in the 130s around that time.  This may be contributing somewhat to his symptoms.  The patient may  benefit from a trial of beta-blocker, low-dose as his baseline heart rate is 60s to 70s.  Metoprolol 12.5 mg twice daily.  Discussed with Dr. Acie Fredrickson   Radial site care instructions added to discharge instructions.  For questions or updates, please contact [place] Please consult [email or web address] for contact info under    Signed, Daune Perch, NP  06/20/2018, 8:36 AM

## 2018-06-22 ENCOUNTER — Encounter: Payer: Self-pay | Admitting: Cardiology

## 2018-06-22 ENCOUNTER — Telehealth: Payer: Self-pay | Admitting: Cardiovascular Disease

## 2018-06-22 NOTE — Telephone Encounter (Signed)
New message     Pt c/o medication issue:  1. Name of Medication:  metoprolol tartrate (LOPRESSOR) 25 MG tablet  2. How are you currently taking this medication (dosage and times per day)? Take 0.5 tablets (12.5 mg total) by mouth daily.     3. Are you having a reaction (difficulty breathing--STAT)? no  4. What is your medication issue? Pt questioning dosage and times per day

## 2018-06-22 NOTE — Telephone Encounter (Signed)
The metoprolol dose was lowered on the day of discharge to 12 and half milligrams a day due to relatively low blood pressure and a low normal heart rate.  He has clean coronary arteries.  I agree with his current dose of metoprolol 12.5 mg once a day .   Since he does not have any CAD, we may be able to stop it completely when he is reassessd by Justin Ades, NP

## 2018-06-22 NOTE — Telephone Encounter (Signed)
Patient called to ask about Metoprolol dose. He states the heart doctor  started him on Metoprolol tartrate 12.5 mg twice daily during hospitalization on 1/20. Patient states at discharge, Dr. Zigmund Daniel decreased his dose to 12.5 mg once daily. Patient states he wants to know what the heart doctor thinks about that because we know more about the heart. Patient has an appointment on 2/12 with Pecolia Ades, NP. I advised that I will forward message to Dr. Acie Fredrickson, who saw him on 1/20, for advice and call him back. Patient verbalized understanding and thanked me for the call.

## 2018-06-26 NOTE — Telephone Encounter (Signed)
Called patient and advised that per Dr. Acie Fredrickson he may continue Metoprolol tartrate at 12.5 mg daily until his appointment on 2/12 with Pecolia Ades, NP. He verbalized understanding and agreement and thanked me for the call.

## 2018-07-12 ENCOUNTER — Encounter: Payer: Self-pay | Admitting: Cardiology

## 2018-07-12 ENCOUNTER — Encounter (INDEPENDENT_AMBULATORY_CARE_PROVIDER_SITE_OTHER): Payer: Self-pay

## 2018-07-12 ENCOUNTER — Ambulatory Visit (INDEPENDENT_AMBULATORY_CARE_PROVIDER_SITE_OTHER): Payer: 59 | Admitting: Cardiology

## 2018-07-12 VITALS — BP 134/78 | HR 90 | Ht 74.0 in | Wt 211.4 lb

## 2018-07-12 DIAGNOSIS — F419 Anxiety disorder, unspecified: Secondary | ICD-10-CM

## 2018-07-12 DIAGNOSIS — K219 Gastro-esophageal reflux disease without esophagitis: Secondary | ICD-10-CM

## 2018-07-12 DIAGNOSIS — R0789 Other chest pain: Secondary | ICD-10-CM

## 2018-07-12 MED ORDER — METOPROLOL SUCCINATE ER 25 MG PO TB24: 25.0000 mg | ORAL_TABLET | Freq: Every day | ORAL | 3 refills | Status: DC

## 2018-07-12 NOTE — Progress Notes (Signed)
Cardiology Office Note:    Date:  07/12/2018   [name], DOB 11/04/1955, MRN [id number]  PCP:  Everardo Beals, NP  Cardiologist:  Mertie Moores, MD  Referring MD: Everardo Beals, NP   Chief Complaint  Patient presents with  . Hospitalization Follow-up    Atypical chest pain, post cath    History of Present Illness:    Justin English is a 63 y.o. male with a past medical history significant for asthma, GERD and anxiety.  He presented to the hospital on 06/17/2018 with complaints of chest pain.  A CTA of the chest was negative for aortic dissection but did note calcifications in the LAD distribution.  Troponins were negative.  He had a review that showed possible diaphragmatic attenuation, EF 48%.  With his concerning symptoms and questionable stress test the patient was taken for cardiac catheterization on 06/19/2018 that revealed normal coronary arteries and normal filling pressures.  The patient was discharged on metoprolol tartrate 12.5 mg only once a day due to soft blood pressure and normal heart rate.  Justin English is here today alone. He is no longer having chest pressure or anxiety like he was having. Even his burping has gotten better. He feels like the metoprolol is helping. He is back to work and doing well. He does have stress at work and feels that it likely is the cause of most of his symptoms.  He denies any dyspnea on exertion, orthopnea, PND, edema, palpitations.  Past Medical History:  Diagnosis Date  . Allergy    seasonal  . Anxiety   . Asthma   . GERD (gastroesophageal reflux disease)   . Hemorrhoids   . Sinus infection    hx of frequent sinus infection    Past Surgical History:  Procedure Laterality Date  . INGUINAL HERNIA REPAIR Left    was 63 years old  . LEFT HEART CATH AND CORONARY ANGIOGRAPHY N/A 06/19/2018   Procedure: LEFT HEART CATH AND CORONARY ANGIOGRAPHY;  Surgeon: Lorretta Harp, MD;  Location: [facility];  Service:  Cardiovascular;  Laterality: N/A;    Current Medications: Current Meds  Medication Sig  . albuterol (PROVENTIL HFA;VENTOLIN HFA) 108 (90 Base) MCG/ACT inhaler Inhale 1-2 puffs into the lungs every 6 (six) hours as needed for wheezing or shortness of breath.  . busPIRone (BUSPAR) 15 MG tablet Take 15 mg by mouth daily as needed (anxiety).   . chlorpheniramine (CHLOR-TRIMETON) 4 MG tablet Take 4 mg by mouth as needed for allergies.  . pantoprazole (PROTONIX) 40 MG tablet Take 40 mg by mouth 2 (two) times daily.   Marland Kitchen QVAR 80 MCG/ACT inhaler Inhale 2 puffs into the lungs daily as needed (SOB, wheezing).   . [DISCONTINUED] metoprolol tartrate (LOPRESSOR) 25 MG tablet Take 0.5 tablets (12.5 mg total) by mouth daily.     Allergies:   Zithromax [azithromycin]   Social History   Socioeconomic History  . Marital status: Married    Spouse name: Not on file  . Number of children: 3  . Years of education: Not on file  . Highest education level: Not on file  Occupational History  . Occupation: Animal nutritionist  Social Needs  . Financial resource strain: Not on file  . Food insecurity:    Worry: Not on file    Inability: Not on file  . Transportation needs:    Medical: Not on file    Non-medical: Not on file  Tobacco Use  . Smoking  status: Former Smoker    Types: Cigarettes    Last attempt to quit: 03/01/2015    Years since quitting: 3.3  . Smokeless tobacco: Never Used  Substance and Sexual Activity  . Alcohol use: Not Currently  . Drug use: No  . Sexual activity: Not on file  Lifestyle  . Physical activity:    Days per week: Not on file    Minutes per session: Not on file  . Stress: Not on file  Relationships  . Social connections:    Talks on phone: Not on file    Gets together: Not on file    Attends religious service: Not on file    Active member of club or organization: Not on file    Attends meetings of clubs or organizations: Not on file    Relationship status: Not on file    Other Topics Concern  . Not on file  Social History Narrative  . Not on file     Family History: The patient's family history includes Arthritis in his brother; Bronchitis in his mother; Lung cancer in his father; Prostate cancer in his father. There is no history of Colon cancer, Esophageal cancer, Inflammatory bowel disease, Liver disease, Pancreatic cancer, Rectal cancer, or Stomach cancer. ROS:   Please see the history of present illness.     All other systems reviewed and are negative.  EKGs/Labs/Other Studies Reviewed:    The following studies were reviewed today:  Left Heart Cath 06/19/18 IMPRESSION:Mr. Haug has clean coronary arteries and normal filling pressures. He has normal ejection fraction by 2D echo. His symptoms are noncardiac. The sheath was removed and a TR band was placed on the right wrist to achieve patent hemostasis. The patient left the lab in stable condition.  [name]. MD, [facility]  Echocardiogram 06/19/2018 Study Conclusions - Left ventricle: The cavity size was normal. Wall thickness was normal. Systolic function was normal. The estimated ejection fraction was in the range of 60% to 65%. Wall motion was normal; there were no regional wall motion abnormalities. Doppler parameters are consistent with abnormal left ventricular relaxation (grade 1 diastolic dysfunction). Normal strain pattern. - Aortic valve: There was no stenosis. - Mitral valve: There was no regurgitation. - Right ventricle: The cavity size was normal. Systolic function was normal. - Pulmonary arteries: No complete TR doppler jet so unable to estimate PA systolic pressure. - Inferior vena cava: The vessel was normal in size. The respirophasic diameter changes were in the normal range (>= 50%), consistent with normal central venous pressure.  Impressions: - Normal LV size and systolic function, EF 56-43%. Normal RV size and systolic function. No  significant valvular abnormalities.   EKG:  EKG is not ordered today.    Recent Labs: 03/16/2018: TSH 2.24 06/17/2018: ALT 23 06/20/2018: BUN 15; Creatinine, Ser 1.18; Hemoglobin 14.3; Platelets 257; Potassium 3.8; Sodium 138   Recent Lipid Panel    Component Value Date/Time   CHOL 149 06/18/2018 0527   TRIG 84 06/18/2018 0527   HDL 30 (L) 06/18/2018 0527   CHOLHDL 5.0 06/18/2018 0527   VLDL 17 06/18/2018 0527   LDLCALC 102 (H) 06/18/2018 0527    Physical Exam:    VS:  BP 134/78   Pulse 90   Ht 6\' 2"  (1.88 m)   Wt 211 lb 6.4 oz (95.9 kg)   SpO2 98%   BMI 27.14 kg/m     Wt Readings from Last 3 Encounters:  07/12/18 211 lb 6.4 oz (95.9 kg)  06/19/18 202 lb 8 oz (91.9 kg)  05/23/18 203 lb (92.1 kg)     Physical Exam  Constitutional: He is oriented to person, place, and time. He appears well-developed and well-nourished. No distress.  HENT:  Head: Normocephalic and atraumatic.  Neck: Normal range of motion. Neck supple. No JVD present.  Cardiovascular: Normal rate, regular rhythm, normal heart sounds and intact distal pulses. Exam reveals no gallop and no friction rub.  No murmur heard. Pulmonary/Chest: Effort normal and breath sounds normal. No respiratory distress. He has no wheezes. He has no rales.  Abdominal: Soft. Bowel sounds are normal.  Musculoskeletal: Normal range of motion.        General: No deformity or edema.  Neurological: He is alert and oriented to person, place, and time.  Skin: Skin is warm and dry.  Psychiatric: He has a normal mood and affect. His behavior is normal. Judgment and thought content normal.  Vitals reviewed.   ASSESSMENT:    1. Other chest pain   2. Anxiety   3. Gastroesophageal reflux disease, esophagitis presence not specified    PLAN:    In order of problems listed above:  Atypical chest pain -Patient found to have normal coronary arteries by heart catheterization on 06/19/2018. -His chest pain is much improved since  hospitalization.  He feels like the metoprolol is helping his chest pain and anxiety.  We will switch him to Toprol-XL 25 mg daily so that he will be covered throughout the day.  Anxiety -The patient has been followed by his PCP for anxiety and is on BuSpar which he does not feel helps him much.  He does feel like the metoprolol is somehow helping his anxiety.  GERD -Patient is treated with Protonix. He feels like his burping is better since his hospitalization.    Medication Adjustments/Labs and Tests Ordered: Current medicines are reviewed at length with the patient today.  Concerns regarding medicines are outlined above. Labs and tests ordered and medication changes are outlined in the patient instructions below:  Patient Instructions  Medication Instructions:  1.) STOP: Metoprolol 12.5 mg  2.) START: Toprol 25 mg daily   If you need a refill on your cardiac medications before your next appointment, please call your pharmacy.   Lab work: None  If you have labs (blood work) drawn today and your tests are completely normal, you will receive your results only by: Marland Kitchen MyChart Message (if you have MyChart) OR . A paper copy in the mail If you have any lab test that is abnormal or we need to change your treatment, we will call you to review the results.  Testing/Procedures: None  Follow-Up: At [facility], you and your health needs are our priority.  As part of our continuing mission to provide you with exceptional heart care, we have created designated Provider Care Teams.  These Care Teams include your primary Cardiologist (physician) and Advanced Practice Providers (APPs -  Physician Assistants and Nurse Practitioners) who all work together to provide you with the care you need, when you need it. You will need a follow up appointment in:  1 years.  Please call our office 2 months in advance to schedule this appointment.  You may see Mertie Moores, MD or one of the following Advanced  Practice Providers on your designated Care Team: Richardson Dopp, PA-C [place], [place] . Daune Perch, NP  Any Other Special Instructions Will Be Listed Below (If Applicable).        Signed,  Daune Perch, NP  07/12/2018 4:33 PM    [facility]

## 2018-07-12 NOTE — Patient Instructions (Signed)
Medication Instructions:  1.) STOP: Metoprolol 12.5 mg  2.) START: Toprol 25 mg daily   If you need a refill on your cardiac medications before your next appointment, please call your pharmacy.   Lab work: None  If you have labs (blood work) drawn today and your tests are completely normal, you will receive your results only by: Marland Kitchen MyChart Message (if you have MyChart) OR . A paper copy in the mail If you have any lab test that is abnormal or we need to change your treatment, we will call you to review the results.  Testing/Procedures: None  Follow-Up: At [facility], you and your health needs are our priority.  As part of our continuing mission to provide you with exceptional heart care, we have created designated Provider Care Teams.  These Care Teams include your primary Cardiologist (physician) and Advanced Practice Providers (APPs -  Physician Assistants and Nurse Practitioners) who all work together to provide you with the care you need, when you need it. You will need a follow up appointment in:  1 years.  Please call our office 2 months in advance to schedule this appointment.  You may see Mertie Moores, MD or one of the following Advanced Practice Providers on your designated Care Team: Richardson Dopp, PA-C [place], [place] . Daune Perch, NP  Any Other Special Instructions Will Be Listed Below (If Applicable).

## 2018-07-18 ENCOUNTER — Ambulatory Visit (INDEPENDENT_AMBULATORY_CARE_PROVIDER_SITE_OTHER): Payer: 59 | Admitting: Gastroenterology

## 2018-07-18 ENCOUNTER — Encounter: Payer: Self-pay | Admitting: Gastroenterology

## 2018-07-18 VITALS — BP 114/78 | HR 84 | Ht 72.0 in | Wt 210.1 lb

## 2018-07-18 DIAGNOSIS — D126 Benign neoplasm of colon, unspecified: Secondary | ICD-10-CM | POA: Diagnosis not present

## 2018-07-18 DIAGNOSIS — R0789 Other chest pain: Secondary | ICD-10-CM

## 2018-07-18 DIAGNOSIS — K449 Diaphragmatic hernia without obstruction or gangrene: Secondary | ICD-10-CM | POA: Diagnosis not present

## 2018-07-18 DIAGNOSIS — K219 Gastro-esophageal reflux disease without esophagitis: Secondary | ICD-10-CM | POA: Diagnosis not present

## 2018-07-18 NOTE — Patient Instructions (Addendum)
If you are age 61 or older, your body mass index should be between 23-30. Your Body mass index is 28.5 kg/m. If this is out of the aforementioned range listed, please consider follow up with your Primary Care Provider.  If you are age 69 or younger, your body mass index should be between 19-25. Your Body mass index is 28.5 kg/m. If this is out of the aformentioned range listed, please consider follow up with your Primary Care Provider.   Please contact office if you have any further problems with Dysphasia.    Continue Protonix twice daily until the end of the month,.  Starting in March for the next 3 months take once daily.    May use Gas-x as needed.   We will contact you at a later time to schedule 3 month office follow up.   Thank you for choosing me and [facility].  Dr. Rush Landmark

## 2018-07-18 NOTE — Progress Notes (Signed)
[place] VISIT   Primary Care Provider Everardo Beals, NP [place] [address] [phone]  Patient Profile: Justin English is a 63 y.o. male with a pmh significant for Asthma, Anxiety, GERD, hiatal hernia.  The patient presents to the [facility] for an evaluation and management of problem(s) noted below:  Problem List 1. Atypical chest pain   2. Gastroesophageal reflux disease, esophagitis presence not specified   3. Hiatal hernia   4. Tubular adenoma of colon     History of Present Illness: Please see initial consultation note for full details of HPI.    Interval History Today, the patient returns for scheduled follow-up.  Because of some issues with his substernal chest discomfort he eventually returned to the emergency department and ended up being admitted for a few days.  He underwent a cardiac catheterization due to a CT coronary that showed some LAD calcifications.  The patient's cardiac catheterization showed clean coronary arteries and normal filling pressures as well as a normal ejection fraction as noted by a 2D TTE.  It was felt that his symptoms were noncardiac in etiology.  The patient was initiated on blood pressure medication including metoprolol.  Since initiation of his metoprolol and his continue Protonix he is done well.  He does not have any symptoms of dysphagia at this point in time.  He is not having any substernal chest discomfort either.  He is still having bloating at times but has not used Gas-X or any other therapies.  This occurs more often if he eats quickly or if he is moving about his body in different positions.   GI Review of Systems Positive as above Negative for nausea, vomiting, change in bowel habits, melena, hematochezia, dysphagia, odynophagia  Review of Systems General: Denies fevers/chills Cardiovascular: Denies current chest pain Pulmonary: Denies shortness of  breath Gastroenterological: See HPI Genitourinary: Denies darkened urine Hematological: Denies easy bruising Dermatological: Denies jaundice Psychological: Mood is happy that things are going well Musculoskeletal: Denies new arthralgias   Medications Current Outpatient Medications  Medication Sig Dispense Refill  . albuterol (PROVENTIL HFA;VENTOLIN HFA) 108 (90 Base) MCG/ACT inhaler Inhale 1-2 puffs into the lungs every 6 (six) hours as needed for wheezing or shortness of breath.    . busPIRone (BUSPAR) 15 MG tablet Take 15 mg by mouth daily as needed (anxiety).     . chlorpheniramine (CHLOR-TRIMETON) 4 MG tablet Take 4 mg by mouth as needed for allergies.    . metoprolol succinate (TOPROL XL) 25 MG 24 hr tablet Take 1 tablet (25 mg total) by mouth daily. 90 tablet 3  . pantoprazole (PROTONIX) 40 MG tablet Take 40 mg by mouth 2 (two) times daily.   3  . QVAR 80 MCG/ACT inhaler Inhale 2 puffs into the lungs daily as needed (SOB, wheezing).      No current facility-administered medications for this visit.     Allergies Allergies  Allergen Reactions  . Zithromax [Azithromycin] Itching    ZPAC/ caused hives    Histories Past Medical History:  Diagnosis Date  . Allergy    seasonal  . Anxiety   . Asthma   . GERD (gastroesophageal reflux disease)   . Hemorrhoids   . Hiatal hernia   . Sinus infection    hx of frequent sinus infection   Past Surgical History:  Procedure Laterality Date  . INGUINAL HERNIA REPAIR Left    was 63 years old  . LEFT HEART CATH  AND CORONARY ANGIOGRAPHY N/A 06/19/2018   Procedure: LEFT HEART CATH AND CORONARY ANGIOGRAPHY;  Surgeon: Lorretta Harp, MD;  Location: [facility];  Service: Cardiovascular;  Laterality: N/A;   Social History   Socioeconomic History  . Marital status: Married    Spouse name: Not on file  . Number of children: 3  . Years of education: Not on file  . Highest education level: Not on file  Occupational History  .  Occupation: Animal nutritionist  Social Needs  . Financial resource strain: Not on file  . Food insecurity:    Worry: Not on file    Inability: Not on file  . Transportation needs:    Medical: Not on file    Non-medical: Not on file  Tobacco Use  . Smoking status: Former Smoker    Types: Cigarettes    Last attempt to quit: 03/01/2015    Years since quitting: 3.3  . Smokeless tobacco: Never Used  Substance and Sexual Activity  . Alcohol use: Not Currently  . Drug use: No  . Sexual activity: Not on file  Lifestyle  . Physical activity:    Days per week: Not on file    Minutes per session: Not on file  . Stress: Not on file  Relationships  . Social connections:    Talks on phone: Not on file    Gets together: Not on file    Attends religious service: Not on file    Active member of club or organization: Not on file    Attends meetings of clubs or organizations: Not on file    Relationship status: Not on file  . Intimate partner violence:    Fear of current or ex partner: Not on file    Emotionally abused: Not on file    Physically abused: Not on file    Forced sexual activity: Not on file  Other Topics Concern  . Not on file  Social History Narrative  . Not on file   Family History  Problem Relation Age of Onset  . Bronchitis Mother   . Prostate cancer Father   . Lung cancer Father   . Arthritis Brother   . Colon cancer Neg Hx   . Esophageal cancer Neg Hx   . Inflammatory bowel disease Neg Hx   . Liver disease Neg Hx   . Pancreatic cancer Neg Hx   . Rectal cancer Neg Hx   . Stomach cancer Neg Hx    I have reviewed his medical, social, and family history in detail and updated the electronic medical record as necessary.    PHYSICAL EXAMINATION  BP 114/78 (BP Location: Left Arm, Patient Position: Sitting, Cuff Size: Normal)   Pulse 84   Ht 6' (1.829 m)   Wt 210 lb 2 oz (95.3 kg)   BMI 28.50 kg/m  Wt Readings from Last 3 Encounters:  07/18/18 210 lb 2 oz (95.3 kg)    07/12/18 211 lb 6.4 oz (95.9 kg)  06/19/18 202 lb 8 oz (91.9 kg)  GEN: NAD, appears stated age, doesn't appear chronically ill PSYCH: Cooperative, without pressured speech EYE: Conjunctivae pink, sclerae anicteric ENT: MMM CV: RR without R/Gs  RESP: CTAB posteriorly, without wheezing GI: NABS, soft, NT/ND, without rebound or guarding, no HSM appreciated MSK/EXT: No lower extremity edema  SKIN: No jaundice NEURO:  Alert & Oriented x 3, no focal deficits   REVIEW OF DATA  I reviewed the following data at the time of this encounter:  GI Procedures and Studies  Previously reviewed  Laboratory Studies  Reviewed in epic  Imaging Studies  No relevant studies   ASSESSMENT  Mr. Hannis is a 63 y.o. male with a pmh significant for Asthma, Anxiety, GERD, hiatal hernia.  The patient is seen today for evaluation and management of:  1. Atypical chest pain   2. Gastroesophageal reflux disease, esophagitis presence not specified   3. Hiatal hernia   4. Tubular adenoma of colon    The patient remains hemodynamically stable.  He is actually doing very well from a perspective of his heartburn symptoms.  He is not having any esophageal dysphagia symptoms either.  He had some persistent substernal chest discomfort and underwent a cardiac catheterization which showed no evidence of coronary artery disease.  After starting metoprolol however, he does feel significantly improved and is not having that substernal chest discomfort.  The patient will return in follow-up in a few months and if he has any symptoms of recurrent dysphagia then we can consider the role of Schatzki ring disruption/dilation at that point.  The patient will be due for a 5-year colonoscopy in setting of colon polyps based on his most recent colonoscopy findings.  We will have the patient remain on twice daily dosing of PPI through the end of this month.  In March and April he will go down to once daily dosing of PPI.  He will  maintain that until we see him back in clinic.  When we see him back in clinic if he has done well with once daily dosing of PPI we will transition him to 20 mg once daily.  In regards to the patient's bloating we discussed considering evaluation for SIBO as well as PPI however we will begin with Gas-X/simethicone use on an as-needed basis.  All patient questions were answered, to the best of my ability, and the patient agrees to the aforementioned plan of action with follow-up as indicated.   PLAN  Follow-up in 3 months Continue Protonix 40 twice daily through February March/April Protonix 40 mg once daily Follow-up in May and consider decreasing dose of Protonix further If recurrent issues of dysphagia patient to let us know and we will proceed with a dilation/disruption of Schatzki ring via EGD Colonoscopy recall in 5 years Gas-X/simethicone as needed for bloating If issues persist consider SIBO/PPI evaluation   No orders of the defined types were placed in this encounter.   New Prescriptions   No medications on file   Modified Medications   No medications on file    Planned Follow Up: No follow-ups on file.   Justice Britain, MD [facility] Office # [phone]

## 2018-07-19 ENCOUNTER — Encounter: Payer: Self-pay | Admitting: Gastroenterology

## 2018-07-20 DIAGNOSIS — K449 Diaphragmatic hernia without obstruction or gangrene: Secondary | ICD-10-CM | POA: Insufficient documentation

## 2018-07-25 ENCOUNTER — Telehealth: Payer: Self-pay | Admitting: Cardiovascular Disease

## 2018-07-25 NOTE — Telephone Encounter (Signed)
Pt c/o medication issue:  1. Name of Medication: metoprolol succinate (TOPROL XL) 25 MG 24 hr tablet  2. How are you currently taking this medication (dosage and times per day)? 25 mg 1x daily  3. Are you having a reaction (difficulty breathing--STAT)? no  4. What is your medication issue? Pt states that his feet recently started feeling itchy, tingly, and had a slight burning sensation.  Pt says this started within the last month or so. He says he was given an rx for an antibiotic, and if the antibiotic caused the reaction.

## 2018-07-25 NOTE — Telephone Encounter (Signed)
Spoke with patient who complains of tingling in his feet and is wondering if it is caused by metoprolol. He is not sure whether the increase in his dose made by N. Phylliss Bob, NP on 2/12 coincides with his symptoms. He states he also recently started an antibiotic and steroid for an infection. He denies numbness, color change, or swelling in lower extremities. He denies pain in his legs, states only has tingling in his feet. He asks if this could signify that something is wrong with his circulation. I reviewed the s/s of PAD and advised him to continue to monitor. I advised him to follow-up with PCP or to call back with worsening symptoms. He states he is on his feet most of the day for his job. I advised him to elevate his legs above heart level when he gets home from work. He verbalized understanding and agreement with plan and thanked me for the call.

## 2018-08-04 ENCOUNTER — Other Ambulatory Visit: Payer: Self-pay

## 2018-08-04 MED ORDER — PANTOPRAZOLE SODIUM 40 MG PO TBEC: 40.0000 mg | DELAYED_RELEASE_TABLET | Freq: Two times a day (BID) | ORAL | 0 refills | Status: DC

## 2018-08-04 NOTE — Telephone Encounter (Signed)
According to patient's last office visit he is to take the pantoprazole once daily now.  I spoke with Justin English and he is still doing twice a day. I reminded him of what Dr Rush Landmark said. His refill I told him is going to say twice daily but I told him to try once daily as instructed. He will just have extra pills in the refill sent in so it will last him longer. He verbalized understanding.

## 2018-08-23 ENCOUNTER — Encounter (HOSPITAL_COMMUNITY): Payer: Self-pay

## 2018-08-23 ENCOUNTER — Other Ambulatory Visit: Payer: Self-pay

## 2018-08-23 ENCOUNTER — Emergency Department (HOSPITAL_COMMUNITY): Payer: 59

## 2018-08-23 ENCOUNTER — Emergency Department (HOSPITAL_COMMUNITY)
Admission: EM | Admit: 2018-08-23 | Discharge: 2018-08-23 | Disposition: A | Discharge status: Home / Self Care | Payer: 59 | Attending: Emergency Medicine | Admitting: Emergency Medicine

## 2018-08-23 DIAGNOSIS — Z87891 Personal history of nicotine dependence: Secondary | ICD-10-CM | POA: Insufficient documentation

## 2018-08-23 DIAGNOSIS — J45909 Unspecified asthma, uncomplicated: Secondary | ICD-10-CM | POA: Diagnosis not present

## 2018-08-23 DIAGNOSIS — R05 Cough: Secondary | ICD-10-CM | POA: Diagnosis present

## 2018-08-23 DIAGNOSIS — R0981 Nasal congestion: Secondary | ICD-10-CM | POA: Diagnosis not present

## 2018-08-23 DIAGNOSIS — R059 Cough, unspecified: Secondary | ICD-10-CM

## 2018-08-23 DIAGNOSIS — Z79899 Other long term (current) drug therapy: Secondary | ICD-10-CM | POA: Insufficient documentation

## 2018-08-23 DIAGNOSIS — I10 Essential (primary) hypertension: Secondary | ICD-10-CM | POA: Diagnosis not present

## 2018-08-23 HISTORY — DX: Essential (primary) hypertension: I10

## 2018-08-23 HISTORY — DX: Bronchitis, not specified as acute or chronic: J40

## 2018-08-23 MED ORDER — ALBUTEROL SULFATE HFA 108 (90 BASE) MCG/ACT IN AERS: 1.0000 | INHALATION_SPRAY | Freq: Four times a day (QID) | RESPIRATORY_TRACT | 0 refills | Status: AC | PRN

## 2018-08-23 MED ORDER — FLUTICASONE PROPIONATE 50 MCG/ACT NA SUSP: 1.0000 | Freq: Every day | NASAL | 0 refills | Status: DC

## 2018-08-23 MED ORDER — BENZONATATE 100 MG PO CAPS: 100.0000 mg | ORAL_CAPSULE | Freq: Three times a day (TID) | ORAL | 0 refills | Status: DC

## 2018-08-23 NOTE — ED Triage Notes (Addendum)
Pt reports nasal congestion x2 weeks due to allergies. Pt reports feeling slightly short of breath starting today. Pt reports hx of bronchitis. Pt denies any fever or chest pain.

## 2018-08-23 NOTE — Discharge Instructions (Addendum)
You are seen in the emergency department today for congestion, cough, and trouble breathing.  Your chest x-ray was normal.  Recent your symptoms are related to allergies or a virus.  We are sending home with the following medicines: -Flonase: This is a medicine to help with congestion, use this 1 spray per nostril daily -Tessalon: This is a medicine to help with cough use this every 8 hours as needed for coughing -Inhaler: This is a medicine to help with trouble breathing/wheezing, you use this every 4 to 6 hours as needed.  We have prescribed you new medication(s) today. Discuss the medications prescribed today with your pharmacist as they can have adverse effects and interactions with your other medicines including over the counter and prescribed medications. Seek medical evaluation if you start to experience new or abnormal symptoms after taking one of these medicines, seek care immediately if you start to experience difficulty breathing, feeling of your throat closing, facial swelling, or rash as these could be indications of a more serious allergic reaction   We have limited coronavirus testing at this time. We are instructing patient's with cough to quarantine themselves for 14 days. You may be able to discontinue self quarantine if the following conditions are met:   Persons with COVID-19 who have symptoms and were directed to care for themselves at home may discontinue home isolation under the  following conditions: - It has been at least 7 days have passed since symptoms first appeared. - AND at least 3 days (72 hours) have passed since recovery defined as resolution of fever without the use of fever-reducing medications and improvement in respiratory symptoms (e.g., cough, shortness of breath)  Please follow the below quarantine instructions.   Please follow up with primary care within 3-5 days for re-evaluation- call prior to going to the office to make them aware of your symptoms. Return  to the ER for new or worsening symptoms including but not limited to increased work of breathing, fever, chest pain, passing out, or any other concerns.    Stay home except to get medical care You should restrict activities outside your home, except for getting medical care. Do not go to work, school, or public areas, and do not use public transportation or taxis.  Call ahead before visiting your doctor Before your medical appointment, call the healthcare provider and tell them that you have, or are being evaluated for, COVID-19 infection. This will help the healthcare providers office take steps to keep other people from getting infected. Ask your healthcare provider to call the local or state health department.  Monitor your symptoms Seek prompt medical attention if your illness is worsening (e.g., difficulty breathing). Before going to your medical appointment, call the healthcare provider and tell them that you have, or are being evaluated for, COVID-19 infection. Ask your healthcare provider to call the local or state health department.  Wear a facemask You should wear a facemask that covers your nose and mouth when you are in the same room with other people and when you visit a healthcare provider. People who live with or visit you should also wear a facemask while they are in the same room with you.  Separate yourself from other people in your home As much as possible, you should stay in a different room from other people in your home. Also, you should use a separate bathroom, if available.  Avoid sharing household items You should not share dishes, drinking glasses, cups, eating utensils, towels, bedding, or  other items with other people in your home. After using these items, you should wash them thoroughly with soap and water.  Cover your coughs and sneezes Cover your mouth and nose with a tissue when you cough or sneeze, or you can cough or sneeze into your sleeve.  Throw used tissues in a lined trash can, and immediately wash your hands with soap and water for at least 20 seconds or use an alcohol-based hand rub.  Wash your [facility] your hands often and thoroughly with soap and water for at least 20 seconds. You can use an alcohol-based hand sanitizer if soap and water are not available and if your hands are not visibly dirty. Avoid touching your eyes, nose, and mouth with unwashed hands.   Prevention Steps for Caregivers and Household Members of Individuals Confirmed to have, or Being Evaluated for, COVID-19 Infection Being Cared for in the Home  If you live with, or provide care at home for, a person confirmed to have, or being evaluated for, COVID-19 infection please follow these guidelines to prevent infection:  Follow healthcare providers instructions Make sure that you understand and can help the patient follow any healthcare provider instructions for all care.  Provide for the patients basic needs You should help the patient with basic needs in the home and provide support for getting groceries, prescriptions, and other personal needs.  Monitor the patients symptoms If they are getting sicker, call his or her medical provider and tell them that the patient has, or is being evaluated for, COVID-19 infection. This will help the healthcare providers office take steps to keep other people from getting infected. Ask the healthcare provider to call the local or state health department.  Limit the number of people who have contact with the patient If possible, have only one caregiver for the patient. Other household members should stay in another home or place of residence. If this is not possible, they should stay in another room, or be separated from the patient as much as possible. Use a separate bathroom, if available. Restrict visitors who do not have an essential need to be in the home.  Keep older adults, very young children, and  other sick people away from the patient Keep older adults, very young children, and those who have compromised immune systems or chronic health conditions away from the patient. This includes people with chronic heart, lung, or kidney conditions, diabetes, and cancer.  Ensure good ventilation Make sure that shared spaces in the home have good air flow, such as from an air conditioner or an opened window, weather permitting.  Wash your hands often Wash your hands often and thoroughly with soap and water for at least 20 seconds. You can use an alcohol based hand sanitizer if soap and water are not available and if your hands are not visibly dirty. Avoid touching your eyes, nose, and mouth with unwashed hands. Use disposable paper towels to dry your hands. If not available, use dedicated cloth towels and replace them when they become wet.  Wear a facemask and gloves Wear a disposable facemask at all times in the room and gloves when you touch or have contact with the patients blood, body fluids, and/or secretions or excretions, such as sweat, saliva, sputum, nasal mucus, vomit, urine, or feces.  Ensure the mask fits over your nose and mouth tightly, and do not touch it during use. Throw out disposable facemasks and gloves after using them. Do not reuse. Wash your hands immediately  after removing your facemask and gloves. If your personal clothing becomes contaminated, carefully remove clothing and launder. Wash your hands after handling contaminated clothing. Place all used disposable facemasks, gloves, and other waste in a lined container before disposing them with other household waste. Remove gloves and wash your hands immediately after handling these items.  Do not share dishes, glasses, or other household items with the patient Avoid sharing household items. You should not share dishes, drinking glasses, cups, eating utensils, towels, bedding, or other items with a patient who is confirmed to  have, or being evaluated for, COVID-19 infection. After the person uses these items, you should wash them thoroughly with soap and water.  Wash laundry thoroughly Immediately remove and wash clothes or bedding that have blood, body fluids, and/or secretions or excretions, such as sweat, saliva, sputum, nasal mucus, vomit, urine, or feces, on them. Wear gloves when handling laundry from the patient. Read and follow directions on labels of laundry or clothing items and detergent. In general, wash and dry with the warmest temperatures recommended on the label.  Clean all areas the individual has used often Clean all touchable surfaces, such as counters, tabletops, doorknobs, bathroom fixtures, toilets, phones, keyboards, tablets, and bedside tables, every day. Also, clean any surfaces that may have blood, body fluids, and/or secretions or excretions on them. Wear gloves when cleaning surfaces the patient has come in contact with. Use a diluted bleach solution (e.g., dilute bleach with 1 part bleach and 10 parts water) or a household disinfectant with a label that says EPA-registered for coronaviruses. To make a bleach solution at home, add 1 tablespoon of bleach to 1 quart (4 cups) of water. For a larger supply, add  cup of bleach to 1 gallon (16 cups) of water. Read labels of cleaning products and follow recommendations provided on product labels. Labels contain instructions for safe and effective use of the cleaning product including precautions you should take when applying the product, such as wearing gloves or eye protection and making sure you have good ventilation during use of the product. Remove gloves and wash hands immediately after cleaning.  Monitor yourself for signs and symptoms of illness Caregivers and household members are considered close contacts, should monitor their health, and will be asked to limit movement outside of the home to the extent possible. Follow the monitoring steps  for close contacts listed on the symptom monitoring form.   ? If you have additional questions, contact your local health department or call the epidemiologist on call at [phone] (available 24/7). ? This guidance is subject to change. For the most up-to-date guidance from [facility], please refer to their website: YouBlogs.pl

## 2018-08-23 NOTE — ED Notes (Signed)
Bed: WTR7 Expected date:  Expected time:  Means of arrival:  Comments: 

## 2018-08-23 NOTE — ED Provider Notes (Signed)
[facility] DEPT Provider Note   CSN: [id number] Arrival date & time: 08/23/18  1157    History   Chief Complaint Chief Complaint  Patient presents with  . Nasal Congestion  . Shortness of Breath    HPI Justin English is a 63 y.o. male with a hx of prior tobacco use, asthma, anxiety, GERD, and HTN who presents to the ED with complaints of nasal congestion/rhinorrhea for the past 2 weeks as well as mild dry cough and trouble breathing over the past few days.  Patient states he has had issues with his allergies flaring up with a change in the weather over the past 2 weeks he says he had some watering from his eyes, nasal congestion, and rhinorrhea.  He states that over the past few days he feels that his nose is gotten so congested that it is hard to breathe through, not necessarily having shortness of breath with his lungs.  He states that he has had a dry cough and notes that he feels he is wheezy at times.  He has been taking antihistamines with mild relief.  No other alleviating or aggravating factors.  Denies fever, chills, sore throat, ear pain, chest pain, syncope, recent travel, or exposure to known COVID-19 case.     HPI  Past Medical History:  Diagnosis Date  . Allergy    seasonal  . Anxiety   . Asthma   . Bronchitis   . GERD (gastroesophageal reflux disease)   . Hemorrhoids   . Hiatal hernia   . Hypertension   . Sinus infection    hx of frequent sinus infection    Patient Active Problem List   Diagnosis Date Noted  . Hiatal hernia 07/20/2018  . Tubular adenoma of colon 07/18/2018  . Anxiety 07/12/2018  . Elevated blood pressure reading in office without diagnosis of hypertension   . Eructation   . Shortness of breath   . Chest pain 06/18/2018  . Gastroesophageal reflux disease 03/20/2018  . Atypical chest pain 03/20/2018  . Esophageal dysphagia 03/20/2018  . Colon cancer screening 03/20/2018    Past Surgical History:   Procedure Laterality Date  . INGUINAL HERNIA REPAIR Left    was 63 years old  . LEFT HEART CATH AND CORONARY ANGIOGRAPHY N/A 06/19/2018   Procedure: LEFT HEART CATH AND CORONARY ANGIOGRAPHY;  Surgeon: Lorretta Harp, MD;  Location: [facility];  Service: Cardiovascular;  Laterality: N/A;        Home Medications    Prior to Admission medications   Medication Sig Start Date End Date Taking? Authorizing Provider  albuterol (PROVENTIL HFA;VENTOLIN HFA) 108 (90 Base) MCG/ACT inhaler Inhale 1-2 puffs into the lungs every 6 (six) hours as needed for wheezing or shortness of breath.    [provider]  busPIRone (BUSPAR) 15 MG tablet Take 15 mg by mouth daily as needed (anxiety).     [provider]  chlorpheniramine (CHLOR-TRIMETON) 4 MG tablet Take 4 mg by mouth as needed for allergies.    [provider]  metoprolol succinate (TOPROL XL) 25 MG 24 hr tablet Take 1 tablet (25 mg total) by mouth daily. 07/12/18   Daune Perch, NP  pantoprazole (PROTONIX) 40 MG tablet Take 1 tablet (40 mg total) by mouth 2 (two) times daily. 08/04/18   Mansouraty, Telford Nab., MD  QVAR 80 MCG/ACT inhaler Inhale 2 puffs into the lungs daily as needed (SOB, wheezing).  06/27/16   [provider]  Family History Family History  Problem Relation Age of Onset  . Bronchitis Mother   . Prostate cancer Father   . Lung cancer Father   . Arthritis Brother   . Colon cancer Neg Hx   . Esophageal cancer Neg Hx   . Inflammatory bowel disease Neg Hx   . Liver disease Neg Hx   . Pancreatic cancer Neg Hx   . Rectal cancer Neg Hx   . Stomach cancer Neg Hx     Social History Social History   Tobacco Use  . Smoking status: Former Smoker    Types: Cigarettes    Last attempt to quit: 03/01/2015    Years since quitting: 3.4  . Smokeless tobacco: Never Used  Substance Use Topics  . Alcohol use: Not Currently  . Drug use: No     Allergies   Zithromax [azithromycin]    Review of Systems Review of Systems  Constitutional: Negative for appetite change, chills and fever.  HENT: Positive for congestion and rhinorrhea. Negative for ear pain and sore throat.   Respiratory: Positive for cough, shortness of breath (more through the nose) and wheezing.   Cardiovascular: Negative for chest pain and leg swelling.  Gastrointestinal: Negative for abdominal pain and vomiting.     Physical Exam Updated Vital Signs BP (!) 146/108 (BP Location: Right Arm)   Pulse 88   Temp 97.8 F (36.6 C) (Oral)   Resp 20   SpO2 100%   Physical Exam Vitals signs and nursing note reviewed.  Constitutional:      General: He is not in acute distress.    Appearance: He is well-developed. He is not toxic-appearing.  HENT:     Head: Normocephalic and atraumatic.     Right Ear: Tympanic membrane, ear canal and external ear normal.     Left Ear: Tympanic membrane, ear canal and external ear normal.     Nose: Congestion present.     Comments: Mucosal edema noted.  NO sinus tenderness.     Mouth/Throat:     Mouth: Mucous membranes are moist.     Pharynx: No oropharyngeal exudate or posterior oropharyngeal erythema.     Comments: Posterior oropharynx is symmetric appearing. Patient tolerating own secretions without difficulty. No trismus. No drooling. No hot potato voice. No swelling beneath the tongue, submandibular compartment is soft.  Eyes:     General:        Right eye: No discharge.        Left eye: No discharge.     Extraocular Movements: Extraocular movements intact.     Conjunctiva/sclera: Conjunctivae normal.     Right eye: Right conjunctiva is not injected. No chemosis.    Left eye: Left conjunctiva is not injected. No chemosis.    Pupils: Pupils are equal, round, and reactive to light.     Comments: No periorbital erythema, warmth, swelling, or tenderness.  Neck:     Musculoskeletal: Neck supple. No edema, neck rigidity or crepitus.  Cardiovascular:     Rate and  Rhythm: Normal rate and regular rhythm.  Pulmonary:     Effort: Pulmonary effort is normal. No respiratory distress.     Breath sounds: Normal breath sounds. No wheezing, rhonchi or rales.  Abdominal:     General: There is no distension.     Palpations: Abdomen is soft.     Tenderness: There is no abdominal tenderness.  Skin:    General: Skin is warm and dry.     Findings: No rash.  Neurological:     Mental Status: He is alert.     Comments: Clear speech.   Psychiatric:        Behavior: Behavior normal.     ED Treatments / Results  Labs (all labs ordered are listed, but only abnormal results are displayed) Labs Reviewed - No data to display  EKG None  Radiology No results found.  Procedures Procedures (including critical care time)  Medications Ordered in ED Medications - No data to display   Initial Impression / Assessment and Plan / ED Course  I have reviewed the triage vital signs and the nursing notes.  Pertinent labs & imaging results that were available during my care of the patient were reviewed by me and considered in my medical decision making (see chart for details).    Patient presents with URI type symptoms.  Patient is nontoxic appearing, in no apparent distress, vitals are WNL other than elevated BP- doubt HTN emergency.. Patient is afebrile in the ED, lungs are CTA, CXR  negative for infiltrate, doubt pneumonia. Does not seem to be having true dyspnea, more nasal congestion with trouble breathing through the nose. Not hypoxic. Not tachypneic. There is no wheezing or signs of respiratory distress, does not seem like asthma exacerbation requiring steroids, will provide inhaler for PRN use. Afebrile, no sinus tenderness, doubt acute bacterial sinusitis. Centor score 0, doubt strep pharyngitis. No evidence of AOM on exam. No meningeal signs. Suspect viral vs allergic etiology at this time and will treat supportively with  Flonase, inhaler, and Tessalon.  No travel  or exposures, patient does not meet hospital criteria for testing for COVID-19, given he has a cough have precautionary recommended quarantine per CDC.  I discussed results, treatment plan, need for PCP follow-up, and return precautions with the patient. Provided opportunity for questions, patient confirmed understanding and is in agreement with plan.   Final Clinical Impressions(s) / ED Diagnoses   Final diagnoses:  Nasal congestion  Cough    ED Discharge Orders         Ordered    albuterol (PROVENTIL HFA;VENTOLIN HFA) 108 (90 Base) MCG/ACT inhaler  Every 6 hours PRN     08/23/18 1347    fluticasone (FLONASE) 50 MCG/ACT nasal spray  Daily     08/23/18 1347    benzonatate (TESSALON) 100 MG capsule  Every 8 hours     08/23/18 1347           Delara Shepheard, [place] R, PA-C 08/23/18 1524    Little, Wenda Overland, MD 08/24/18 1928

## 2018-09-26 ENCOUNTER — Other Ambulatory Visit: Payer: Self-pay | Admitting: Gastroenterology

## 2018-11-23 ENCOUNTER — Other Ambulatory Visit: Payer: Self-pay | Admitting: Gastroenterology

## 2019-01-26 ENCOUNTER — Other Ambulatory Visit: Payer: Self-pay | Admitting: Gastroenterology

## 2019-03-26 ENCOUNTER — Other Ambulatory Visit: Payer: Self-pay | Admitting: Gastroenterology

## 2019-04-17 ENCOUNTER — Ambulatory Visit: Payer: Self-pay | Admitting: General Surgery

## 2019-04-17 NOTE — H&P (Signed)
History of Present Illness Ralene Ok MD; 04/17/2019 2:21 PM) The patient is a 63 year old male who presents with an umbilical hernia. Chief Complaint: Umbilical hernia  Patient is a 63 year old male with a several year history of an umbilical hernia. Patient states this is getting larger. Patient states he used to work with [employer] and states that this is likely where this hernia came from. He's had a previous undescended testicle the left side as well as left inguinal hernia had been repaired in the past. Patient states that the umbilical hernia does sometimes get tender and becomes painful. He states that every now that it is red. He states he's to be able to reduce it however recently he states he gained some weight and feels that it cannot be reduced as much.        Past Surgical History Emeline Gins, [place]; 04/17/2019 2:12 PM) Open Inguinal Hernia Surgery  Left.  Diagnostic Studies History Emeline Gins, [place]; 04/17/2019 2:12 PM) Colonoscopy  within last year  Allergies Emeline Gins, [place]; 04/17/2019 2:13 PM) No Known Drug Allergies [04/17/2019]: Allergies Reconciled   Medication History Emeline Gins, CMA; 04/17/2019 2:14 PM) Pantoprazole Sodium (40MG  Tablet DR, Oral) Active. Albuterol Sulfate HFA (108 (90 Base)MCG/ACT Aerosol Soln, Inhalation) Active. Chlorpheniramine Maleate (4MG  Tablet, Oral) Active. Metoprolol Succinate ER (25MG  Tablet ER 24HR, Oral) Active. Montelukast Sodium (10MG  Tablet, Oral) Active. DULoxetine HCl (60MG  Capsule DR Part, Oral) Active. Fluticasone Propionate (50MCG/ACT Suspension, Nasal) Active. Medications Reconciled  Social History Emeline Gins, [place]; 04/17/2019 2:12 PM) Alcohol use  Occasional alcohol use. Caffeine use  Coffee. No drug use  Tobacco use  Former smoker.  Other Problems Emeline Gins, [place]; 04/17/2019 2:12 PM) Anxiety Disorder  Diverticulosis  Gastroesophageal Reflux Disease   Hemorrhoids  Inguinal Hernia     Review of Systems Ralene Ok MD; 04/17/2019 2:20 PM) General Not Present- Appetite Loss, Chills, Fatigue, Fever, Night Sweats, Weight Gain and Weight Loss. Skin Not Present- Change in Wart/Mole, Dryness, Hives, Jaundice, New Lesions, Non-Healing Wounds, Rash and Ulcer. HEENT Present- Seasonal Allergies and Sinus Pain. Not Present- Earache, Hearing Loss, Hoarseness, Nose Bleed, Oral Ulcers, Ringing in the Ears, Sore Throat, Visual Disturbances, Wears glasses/contact lenses and Yellow Eyes. Respiratory Not Present- Bloody sputum, Chronic Cough, Difficulty Breathing, Snoring and Wheezing. Breast Not Present- Breast Mass, Breast Pain, Nipple Discharge and Skin Changes. Cardiovascular Not Present- Chest Pain, Difficulty Breathing Lying Down, Leg Cramps, Palpitations, Rapid Heart Rate, Shortness of Breath and Swelling of Extremities. Gastrointestinal Present- Hemorrhoids and Indigestion. Not Present- Abdominal Pain, Bloating, Bloody Stool, Change in Bowel Habits, Chronic diarrhea, Constipation, Difficulty Swallowing, Excessive gas, Gets full quickly at meals, Nausea, Rectal Pain and Vomiting. Male Genitourinary Not Present- Blood in Urine, Change in Urinary Stream, Frequency, Impotence, Nocturia, Painful Urination, Urgency and Urine Leakage. Musculoskeletal Not Present- Back Pain, Joint Pain, Joint Stiffness, Muscle Pain, Muscle Weakness and Swelling of Extremities. Neurological Not Present- Decreased Memory, Fainting, Headaches, Numbness, Seizures, Tingling, Tremor, Trouble walking and Weakness. Psychiatric Present- Anxiety. Not Present- Bipolar, Change in Sleep Pattern, Depression, Fearful and Frequent crying. Endocrine Present- Cold Intolerance. Not Present- Excessive Hunger, Hair Changes, Heat Intolerance and New Diabetes. Hematology Not Present- Blood Thinners, Easy Bruising, Excessive bleeding, Gland problems, HIV and Persistent Infections. All other  systems negative  Vitals Emeline Gins CMA; 04/17/2019 2:13 PM) 04/17/2019 2:12 PM Weight: 229.6 lb Height: 74.5in Body Surface Area: 2.32 m Body Mass Index: 29.08 kg/m  Temp.: 98.63F  Pulse: 66 (Regular)  BP: 146/92 (Sitting, Left Arm,  Standard)       Physical Exam Ralene Ok MD; 04/17/2019 2:21 PM) The physical exam findings are as follows: Note:Constitutional: No acute distress, conversant, appears stated age  Eyes: Anicteric sclerae, moist conjunctiva, no lid lag  Neck: No thyromegaly, trachea midline, no cervical lymphadenopathy  Lungs: Clear to auscultation biilaterally, normal respiratory effot  Cardiovascular: regular rate & rhythm, no murmurs, no peripheal edema, pedal pulses 2+  GI: Soft, no masses or hepatosplenomegaly, non-tender to palpation  MSK: Normal gait, no clubbing cyanosis, edema  Skin: No rashes, palpation reveals normal skin turgor  Psychiatric: Appropriate judgment and insight, oriented to person, place, and time  Abdomen Inspection Hernias - Umbilical hernia - Incarcerated.    Assessment & Plan Ralene Ok MD; [id number] [id number] PM) UMBILICAL HERNIA WITHOUT OBSTRUCTION AND WITHOUT GANGRENE (K42.9) Impression: 63 year old male with a primary umbilical hernia.  1. The patient will like to proceed to the operating room for laparoscopic umbilical hernia repair with mesh.  2. I discussed with the patient the signs and symptoms of incarceration and strangulation and the need to proceed to the ER should they occur.  3. I discussed with the patient the risks and benefits of the procedure to include but not limited to: Infection, bleeding, damage to surrounding structures, possible need for further surgery, possible nerve pain, and possible recurrence. The patient was understanding and wishes to proceed.

## 2019-04-24 ENCOUNTER — Other Ambulatory Visit: Payer: Self-pay | Admitting: Gastroenterology

## 2019-05-14 NOTE — Progress Notes (Signed)
Anesthesia Chart Review:  Pt had an episode of chest pain Jan 2020 and underwent cardiology eval.  A CTA of the chest was negative for aortic dissection but did note calcifications in the LAD distribution. Myoview showed possible diaphragmatic attenuation, EF 48%.  With his concerning symptoms and questionable stress test the patient was taken for cardiac catheterization on 06/19/2018 that revealed normal coronary arteries and normal filling pressures. Chest pain determined to be non cardiac. Last seen 07/12/18. Per note the pt reported no longer having chest pressure and felt most of his symptoms were due to anxiety which had improved.   EKG 06/17/18: Sinus rhythm. Rate 85. Left anterior fascicular block. Abnormal R-wave progression, late transition. Baseline wander in lead(s) V3. No significant change since last tracing   Cath 06/19/18:  IMPRESSION: Justin English has clean coronary arteries and normal filling pressures.  He has normal ejection fraction by 2D echo.  His symptoms are noncardiac.  The sheath was removed and a TR band was placed on the right wrist to achieve patent hemostasis.  The patient left the lab in stable condition.  TTE 06/19/18: - Left ventricle: The cavity size was normal. Wall thickness was   normal. Systolic function was normal. The estimated ejection   fraction was in the range of 60% to 65%. Wall motion was normal;   there were no regional wall motion abnormalities. Doppler   parameters are consistent with abnormal left ventricular   relaxation (grade 1 diastolic dysfunction). Normal strain   pattern. - Aortic valve: There was no stenosis. - Mitral valve: There was no regurgitation. - Right ventricle: The cavity size was normal. Systolic function   was normal. - Pulmonary arteries: No complete TR doppler jet so unable to   estimate PA systolic pressure. - Inferior vena cava: The vessel was normal in size. The   respirophasic diameter changes were in the normal range (>=  50%),   consistent with normal central venous pressure.   Impressions:   - Normal LV size and systolic function, EF 123456. Normal RV size   and systolic function. No significant valvular abnormalities.    Wynonia Musty [facility] Short Stay Center/Anesthesiology Phone [phone] 05/17/2019 10:10 AM

## 2019-05-15 NOTE — Progress Notes (Signed)
[place], [place] [address] Phone: [phone] Fax: [phone]      Your procedure is scheduled on December 23  Report to [facility] Main Entrance "A" at 1030 A.M., and check in at the Admitting office.  Call this number if you have problems the morning of surgery:  [phone]  Call [phone] if you have any questions prior to your surgery date Monday-Friday 8am-4pm    Remember:  Do not eat or drink after midnight the night before your surgery    Take these medicines the morning of surgery with A SIP OF WATER  albuterol (PROVENTIL HFA;VENTOLIN HFA), Please bring all inhalers with you the day of surgery.  DULoxetine (CYMBALTA) metoprolol succinate (TOPROL XL) pantoprazole (PROTONIX) QVAR if needed   7 days prior to surgery STOP taking any Aspirin (unless otherwise instructed by your surgeon), Aleve, Naproxen, Ibuprofen, Motrin, Advil, Goody's, BC's, all herbal medications, fish oil, and all vitamins.    The Morning of Surgery  Do not wear jewelry  Do not wear lotions, powders, or colognes, or deodorant  Men may shave face and neck.  Do not bring valuables to the hospital.  [facility] is not responsible for any belongings or valuables.  If you are a smoker, DO NOT Smoke 24 hours prior to surgery  If you wear a CPAP at night please bring your mask, tubing, and machine the morning of surgery   Remember that you must have someone to transport you home after your surgery, and remain with you for 24 hours if you are discharged the same day.   Please bring cases for contacts, glasses, hearing aids, dentures or bridgework because it cannot be worn into surgery.    Leave your suitcase in the car.  After surgery it may be brought to your room.  For patients admitted to the hospital, discharge time will be determined by your treatment team.  Patients discharged the day of surgery will not be  allowed to drive home.    Special instructions:   [facility]- Preparing For Surgery  Before surgery, you can play an important role. Because skin is not sterile, your skin needs to be as free of germs as possible. You can reduce the number of germs on your skin by washing with CHG (chlorahexidine gluconate) Soap before surgery.  CHG is an antiseptic cleaner which kills germs and bonds with the skin to continue killing germs even after washing.    Oral Hygiene is also important to reduce your risk of infection.  Remember - BRUSH YOUR TEETH THE MORNING OF SURGERY WITH YOUR REGULAR TOOTHPASTE  Please do not use if you have an allergy to CHG or antibacterial soaps. If your skin becomes reddened/irritated stop using the CHG.  Do not shave (including legs and underarms) for at least 48 hours prior to first CHG shower. It is OK to shave your face.  Please follow these instructions carefully.   1. Shower the NIGHT BEFORE SURGERY and the MORNING OF SURGERY with CHG Soap.   2. If you chose to wash your hair, wash your hair first as usual with your normal shampoo.  3. After you shampoo, rinse your hair and body thoroughly to remove the shampoo.  4. Use CHG as you would any other liquid soap. You can apply CHG directly to the skin and wash gently with a scrungie or a clean washcloth.   5. Apply the CHG Soap to  your body ONLY FROM THE NECK DOWN.  Do not use on open wounds or open sores. Avoid contact with your eyes, ears, mouth and genitals (private parts). Wash Face and genitals (private parts)  with your normal soap.   6. Wash thoroughly, paying special attention to the area where your surgery will be performed.  7. Thoroughly rinse your body with warm water from the neck down.  8. DO NOT shower/wash with your normal soap after using and rinsing off the CHG Soap.  9. Pat yourself dry with a CLEAN TOWEL.  10. Wear CLEAN PAJAMAS to bed the night before surgery, wear comfortable clothes the  morning of surgery  11. Place CLEAN SHEETS on your bed the night of your first shower and DO NOT SLEEP WITH PETS.    Day of Surgery:  Please shower the morning of surgery with the CHG soap Do not apply any deodorants/lotions. Please wear clean clothes to the hospital/surgery center.   Remember to brush your teeth WITH YOUR REGULAR TOOTHPASTE.   Please read over the following fact sheets that you were given.

## 2019-05-16 ENCOUNTER — Other Ambulatory Visit: Payer: Self-pay | Admitting: Gastroenterology

## 2019-05-16 ENCOUNTER — Inpatient Hospital Stay (HOSPITAL_COMMUNITY)
Admission: RE | Admit: 2019-05-16 | Discharge: 2019-05-16 | Disposition: A | Discharge status: Home / Self Care | Payer: 59 | Source: Ambulatory Visit

## 2019-05-18 ENCOUNTER — Other Ambulatory Visit: Payer: Self-pay

## 2019-05-18 ENCOUNTER — Encounter (HOSPITAL_BASED_OUTPATIENT_CLINIC_OR_DEPARTMENT_OTHER): Payer: Self-pay | Admitting: General Surgery

## 2019-05-18 NOTE — Progress Notes (Addendum)
Spoke w/ via phone for pre-op interview---[facility] needs dos----     I stat 8          Lab results------ekg 06-17-18 chart/epic, chest ct 06-17-18 chart/epic, lov cardiology janine hammond np 07-12-18 chart/epic, stress test 06-18-18 chart/epic, echo 06-11-18 chart/epic COVID test ------05-19-19 Arrive at -------945 am 05-23-19 NPO after -----midnight- Medications to take morning of surgery -----qvar and albuterol inhalers prn and bring inhalers, pantaprazole Diabetic medication -----n/a Patient Special Instructions -----hibiclens shower hs and am of surgery Pre-Op special Istructions ----- Patient verbalized understanding of instructions that were given at this phone interview. Patient denies shortness of breath, chest pain, fever, cough a this phone interview.

## 2019-05-19 ENCOUNTER — Other Ambulatory Visit (HOSPITAL_COMMUNITY)
Admission: RE | Admit: 2019-05-19 | Discharge: 2019-05-19 | Disposition: A | Discharge status: Home / Self Care | Payer: 59 | Source: Ambulatory Visit | Attending: General Surgery | Admitting: General Surgery

## 2019-05-19 DIAGNOSIS — Z20828 Contact with and (suspected) exposure to other viral communicable diseases: Secondary | ICD-10-CM | POA: Insufficient documentation

## 2019-05-19 DIAGNOSIS — Z01812 Encounter for preprocedural laboratory examination: Secondary | ICD-10-CM | POA: Insufficient documentation

## 2019-05-20 LAB — NOVEL CORONAVIRUS, NAA (HOSP ORDER, SEND-OUT TO REF LAB; TAT 18-24 HRS): SARS-CoV-2, NAA: NOT DETECTED

## 2019-05-22 NOTE — Progress Notes (Signed)
Pt has a surgery time change for tomorrow.  Called and spoke w/ pt via phone , he verbalized understanding to arrive at [facility] 05-23-2019.

## 2019-05-23 ENCOUNTER — Ambulatory Visit (HOSPITAL_BASED_OUTPATIENT_CLINIC_OR_DEPARTMENT_OTHER): Payer: 59 | Admitting: Physician Assistant

## 2019-05-23 ENCOUNTER — Encounter (HOSPITAL_BASED_OUTPATIENT_CLINIC_OR_DEPARTMENT_OTHER)
Admission: RE | Disposition: A | Discharge status: Home / Self Care | Payer: Self-pay | Source: Ambulatory Visit | Attending: General Surgery

## 2019-05-23 ENCOUNTER — Ambulatory Visit (HOSPITAL_BASED_OUTPATIENT_CLINIC_OR_DEPARTMENT_OTHER)
Admission: RE | Admit: 2019-05-23 | Discharge: 2019-05-23 | Disposition: A | Discharge status: Home / Self Care | Payer: 59 | Source: Ambulatory Visit | Attending: General Surgery | Admitting: General Surgery

## 2019-05-23 ENCOUNTER — Other Ambulatory Visit: Payer: Self-pay

## 2019-05-23 ENCOUNTER — Encounter (HOSPITAL_BASED_OUTPATIENT_CLINIC_OR_DEPARTMENT_OTHER): Payer: Self-pay | Admitting: General Surgery

## 2019-05-23 DIAGNOSIS — K219 Gastro-esophageal reflux disease without esophagitis: Secondary | ICD-10-CM | POA: Diagnosis not present

## 2019-05-23 DIAGNOSIS — K429 Umbilical hernia without obstruction or gangrene: Secondary | ICD-10-CM | POA: Insufficient documentation

## 2019-05-23 DIAGNOSIS — Z87891 Personal history of nicotine dependence: Secondary | ICD-10-CM | POA: Diagnosis not present

## 2019-05-23 DIAGNOSIS — Z79899 Other long term (current) drug therapy: Secondary | ICD-10-CM | POA: Insufficient documentation

## 2019-05-23 DIAGNOSIS — F419 Anxiety disorder, unspecified: Secondary | ICD-10-CM | POA: Insufficient documentation

## 2019-05-23 HISTORY — DX: Diverticulitis of intestine, part unspecified, without perforation or abscess without bleeding: K57.92

## 2019-05-23 HISTORY — PX: UMBILICAL HERNIA REPAIR: SHX196

## 2019-05-23 LAB — POCT I-STAT, CHEM 8
BUN: 29 mg/dL — ABNORMAL HIGH (ref 8–23)
Calcium, Ion: 1.2 mmol/L (ref 1.15–1.40)
Chloride: 105 mmol/L (ref 98–111)
Creatinine, Ser: 1.5 mg/dL — ABNORMAL HIGH (ref 0.61–1.24)
Glucose, Bld: 110 mg/dL — ABNORMAL HIGH (ref 70–99)
HCT: 46 % (ref 39.0–52.0)
Hemoglobin: 15.6 g/dL (ref 13.0–17.0)
Potassium: 4.7 mmol/L (ref 3.5–5.1)
Sodium: 139 mmol/L (ref 135–145)
TCO2: 27 mmol/L (ref 22–32)

## 2019-05-23 SURGERY — REPAIR, HERNIA, UMBILICAL, LAPAROSCOPIC
Anesthesia: General | Site: Abdomen

## 2019-05-23 MED ORDER — PROPOFOL 10 MG/ML IV BOLUS
INTRAVENOUS | Status: AC
  Filled 2019-05-23: qty 20

## 2019-05-23 MED ORDER — 0.9 % SODIUM CHLORIDE (POUR BTL) OPTIME
TOPICAL | Status: DC | PRN
  Administered 2019-05-23: 500 mL

## 2019-05-23 MED ORDER — ONDANSETRON HCL 4 MG/2ML IJ SOLN
INTRAMUSCULAR | Status: AC
  Filled 2019-05-23: qty 2

## 2019-05-23 MED ORDER — CEFAZOLIN SODIUM-DEXTROSE 2-4 GM/100ML-% IV SOLN
2.0000 g | INTRAVENOUS | Status: AC
  Administered 2019-05-23: 2 g via INTRAVENOUS
  Filled 2019-05-23: qty 100

## 2019-05-23 MED ORDER — PROPOFOL 10 MG/ML IV BOLUS
INTRAVENOUS | Status: DC | PRN
  Administered 2019-05-23: 200 mg via INTRAVENOUS

## 2019-05-23 MED ORDER — LABETALOL HCL 5 MG/ML IV SOLN
INTRAVENOUS | Status: DC | PRN
  Administered 2019-05-23: 10 mg via INTRAVENOUS

## 2019-05-23 MED ORDER — HYDROCODONE-ACETAMINOPHEN 7.5-325 MG PO TABS
1.0000 | ORAL_TABLET | Freq: Once | ORAL | Status: AC | PRN
  Administered 2019-05-23: 1 via ORAL
  Filled 2019-05-23: qty 1

## 2019-05-23 MED ORDER — FENTANYL CITRATE (PF) 100 MCG/2ML IJ SOLN
INTRAMUSCULAR | Status: DC | PRN
  Administered 2019-05-23: 100 ug via INTRAVENOUS
  Administered 2019-05-23: 50 ug via INTRAVENOUS

## 2019-05-23 MED ORDER — ACETAMINOPHEN 500 MG PO TABS
1000.0000 mg | ORAL_TABLET | ORAL | Status: AC
  Administered 2019-05-23: 1000 mg via ORAL
  Filled 2019-05-23: qty 2

## 2019-05-23 MED ORDER — ACETAMINOPHEN 500 MG PO TABS
ORAL_TABLET | ORAL | Status: AC
  Filled 2019-05-23: qty 2

## 2019-05-23 MED ORDER — SUGAMMADEX SODIUM 500 MG/5ML IV SOLN
INTRAVENOUS | Status: AC
  Filled 2019-05-23: qty 5

## 2019-05-23 MED ORDER — SUGAMMADEX SODIUM 500 MG/5ML IV SOLN
INTRAVENOUS | Status: DC | PRN
  Administered 2019-05-23: 500 mg via INTRAVENOUS

## 2019-05-23 MED ORDER — LACTATED RINGERS IV SOLN
INTRAVENOUS | Status: DC
  Filled 2019-05-23: qty 1000

## 2019-05-23 MED ORDER — ROCURONIUM BROMIDE 10 MG/ML (PF) SYRINGE
PREFILLED_SYRINGE | INTRAVENOUS | Status: AC
  Filled 2019-05-23: qty 10

## 2019-05-23 MED ORDER — DEXAMETHASONE SODIUM PHOSPHATE 10 MG/ML IJ SOLN
INTRAMUSCULAR | Status: DC | PRN
  Administered 2019-05-23: 10 mg via INTRAVENOUS

## 2019-05-23 MED ORDER — DEXAMETHASONE SODIUM PHOSPHATE 10 MG/ML IJ SOLN
INTRAMUSCULAR | Status: AC
  Filled 2019-05-23: qty 1

## 2019-05-23 MED ORDER — SODIUM CHLORIDE 0.9 % IR SOLN
Status: DC | PRN
  Administered 2019-05-23: 1000 mL

## 2019-05-23 MED ORDER — LIDOCAINE 2% (20 MG/ML) 5 ML SYRINGE
INTRAMUSCULAR | Status: DC | PRN
  Administered 2019-05-23: 60 mg via INTRAVENOUS

## 2019-05-23 MED ORDER — MIDAZOLAM HCL 5 MG/5ML IJ SOLN
INTRAMUSCULAR | Status: DC | PRN
  Administered 2019-05-23: 2 mg via INTRAVENOUS

## 2019-05-23 MED ORDER — ONDANSETRON HCL 4 MG/2ML IJ SOLN
INTRAMUSCULAR | Status: DC | PRN
  Administered 2019-05-23: 4 mg via INTRAVENOUS

## 2019-05-23 MED ORDER — HYDROCODONE-ACETAMINOPHEN 7.5-325 MG PO TABS
ORAL_TABLET | ORAL | Status: AC
  Filled 2019-05-23: qty 1

## 2019-05-23 MED ORDER — PHENYLEPHRINE 40 MCG/ML (10ML) SYRINGE FOR IV PUSH (FOR BLOOD PRESSURE SUPPORT)
PREFILLED_SYRINGE | INTRAVENOUS | Status: AC
  Filled 2019-05-23: qty 10

## 2019-05-23 MED ORDER — MEPERIDINE HCL 25 MG/ML IJ SOLN
6.2500 mg | INTRAMUSCULAR | Status: DC | PRN
  Filled 2019-05-23: qty 1

## 2019-05-23 MED ORDER — TRAMADOL HCL 50 MG PO TABS: 50.0000 mg | ORAL_TABLET | Freq: Four times a day (QID) | ORAL | 0 refills | Status: AC | PRN

## 2019-05-23 MED ORDER — MIDAZOLAM HCL 2 MG/2ML IJ SOLN
INTRAMUSCULAR | Status: AC
  Filled 2019-05-23: qty 2

## 2019-05-23 MED ORDER — LIDOCAINE 2% (20 MG/ML) 5 ML SYRINGE
INTRAMUSCULAR | Status: AC
  Filled 2019-05-23: qty 5

## 2019-05-23 MED ORDER — LABETALOL HCL 5 MG/ML IV SOLN
INTRAVENOUS | Status: AC
  Filled 2019-05-23: qty 4

## 2019-05-23 MED ORDER — FENTANYL CITRATE (PF) 100 MCG/2ML IJ SOLN
INTRAMUSCULAR | Status: AC
  Filled 2019-05-23: qty 2

## 2019-05-23 MED ORDER — CEFAZOLIN SODIUM-DEXTROSE 2-4 GM/100ML-% IV SOLN
INTRAVENOUS | Status: AC
  Filled 2019-05-23: qty 100

## 2019-05-23 MED ORDER — FENTANYL CITRATE (PF) 100 MCG/2ML IJ SOLN
25.0000 ug | INTRAMUSCULAR | Status: DC | PRN
  Filled 2019-05-23: qty 1

## 2019-05-23 MED ORDER — CELECOXIB 200 MG PO CAPS
ORAL_CAPSULE | ORAL | Status: AC
  Filled 2019-05-23: qty 1

## 2019-05-23 MED ORDER — CHLORHEXIDINE GLUCONATE CLOTH 2 % EX PADS
6.0000 | MEDICATED_PAD | Freq: Once | CUTANEOUS | Status: DC
  Filled 2019-05-23: qty 6

## 2019-05-23 MED ORDER — KETAMINE HCL 10 MG/ML IJ SOLN
INTRAMUSCULAR | Status: AC
  Filled 2019-05-23: qty 1

## 2019-05-23 MED ORDER — KETAMINE HCL 10 MG/ML IJ SOLN
INTRAMUSCULAR | Status: DC | PRN
  Administered 2019-05-23: 40 mg via INTRAVENOUS

## 2019-05-23 MED ORDER — LIDOCAINE-EPINEPHRINE 1 %-1:100000 IJ SOLN
INTRAMUSCULAR | Status: DC | PRN
  Administered 2019-05-23: 8 mL

## 2019-05-23 MED ORDER — ROCURONIUM BROMIDE 10 MG/ML (PF) SYRINGE
PREFILLED_SYRINGE | INTRAVENOUS | Status: DC | PRN
  Administered 2019-05-23: 60 mg via INTRAVENOUS

## 2019-05-23 MED ORDER — CELECOXIB 200 MG PO CAPS
200.0000 mg | ORAL_CAPSULE | ORAL | Status: AC
  Administered 2019-05-23: 200 mg via ORAL
  Filled 2019-05-23: qty 1

## 2019-05-23 MED ORDER — ONDANSETRON HCL 4 MG/2ML IJ SOLN
4.0000 mg | Freq: Once | INTRAMUSCULAR | Status: DC | PRN
  Filled 2019-05-23: qty 2

## 2019-05-23 SURGICAL SUPPLY — 56 items
ADH SKN CLS APL DERMABOND .7 (GAUZE/BANDAGES/DRESSINGS) ×1
APL PRP STRL LF DISP 70% ISPRP (MISCELLANEOUS) ×1
APL SKNCLS STERI-STRIP NONHPOA (GAUZE/BANDAGES/DRESSINGS)
APPLIER CLIP LOGIC TI 5 (MISCELLANEOUS) IMPLANT
APPLIER CLIP ROT 10 11.4 M/L (STAPLE)
APR CLP MED LRG 11.4X10 (STAPLE)
APR CLP MED LRG 33X5 (MISCELLANEOUS)
BENZOIN TINCTURE PRP APPL 2/3 (GAUZE/BANDAGES/DRESSINGS) IMPLANT
BINDER ABDOMINAL 12 SM 30-45 (SOFTGOODS) IMPLANT
BLADE CLIPPER SURG (BLADE) IMPLANT
CANISTER SUCT 3000ML PPV (MISCELLANEOUS) IMPLANT
CHLORAPREP W/TINT 26 (MISCELLANEOUS) ×3 IMPLANT
CLIP APPLIE ROT 10 11.4 M/L (STAPLE) IMPLANT
COVER WAND RF STERILE (DRAPES) ×3 IMPLANT
DERMABOND ADVANCED (GAUZE/BANDAGES/DRESSINGS) ×2
DERMABOND ADVANCED .7 DNX12 (GAUZE/BANDAGES/DRESSINGS) ×1 IMPLANT
DEVICE SECURE STRAP 25 ABSORB (INSTRUMENTS) ×5 IMPLANT
DEVICE TROCAR PUNCTURE CLOSURE (ENDOMECHANICALS) ×3 IMPLANT
DRAPE UTILITY XL STRL (DRAPES) ×3 IMPLANT
ELECT REM PT RETURN 9FT ADLT (ELECTROSURGICAL) ×3
ELECTRODE REM PT RTRN 9FT ADLT (ELECTROSURGICAL) ×1 IMPLANT
GAUZE SPONGE 4X4 12PLY STRL (GAUZE/BANDAGES/DRESSINGS) ×3 IMPLANT
GLOVE BIO SURGEON STRL SZ7.5 (GLOVE) ×3 IMPLANT
GOWN STRL REUS W/ TWL LRG LVL3 (GOWN DISPOSABLE) ×2 IMPLANT
GOWN STRL REUS W/ TWL XL LVL3 (GOWN DISPOSABLE) ×1 IMPLANT
GOWN STRL REUS W/TWL LRG LVL3 (GOWN DISPOSABLE) ×6
GOWN STRL REUS W/TWL XL LVL3 (GOWN DISPOSABLE) ×3
KIT BASIN OR (CUSTOM PROCEDURE TRAY) ×3 IMPLANT
KIT TURNOVER KIT B (KITS) ×3 IMPLANT
MARKER SKIN DUAL TIP RULER LAB (MISCELLANEOUS) ×3 IMPLANT
MESH VENTRALIGHT ST 6IN CRC (Mesh General) ×2 IMPLANT
NDL INSUFFLATION 14GA 120MM (NEEDLE) ×1 IMPLANT
NDL SPNL 22GX3.5 QUINCKE BK (NEEDLE) IMPLANT
NEEDLE INSUFFLATION 14GA 120MM (NEEDLE) ×3 IMPLANT
NEEDLE SPNL 22GX3.5 QUINCKE BK (NEEDLE) IMPLANT
NS IRRIG 1000ML POUR BTL (IV SOLUTION) ×3 IMPLANT
PACK BASIN DAY SURGERY FS (CUSTOM PROCEDURE TRAY) ×3 IMPLANT
PAD ARMBOARD 7.5X6 YLW CONV (MISCELLANEOUS) ×6 IMPLANT
SCISSORS LAP 5X35 DISP (ENDOMECHANICALS) ×3 IMPLANT
SET IRRIG TUBING LAPAROSCOPIC (IRRIGATION / IRRIGATOR) IMPLANT
SET TUBE SMOKE EVAC HIGH FLOW (TUBING) ×3 IMPLANT
SLEEVE ENDOPATH XCEL 5M (ENDOMECHANICALS) ×3 IMPLANT
SPONGE GAUZE 2X2 8PLY STER LF (GAUZE/BANDAGES/DRESSINGS) ×1
SPONGE GAUZE 2X2 8PLY STRL LF (GAUZE/BANDAGES/DRESSINGS) ×2 IMPLANT
SUT CHROMIC 2 0 SH (SUTURE) ×3 IMPLANT
SUT ETHIBOND 0 MO6 C/R (SUTURE) IMPLANT
SUT MNCRL AB 4-0 PS2 18 (SUTURE) ×3 IMPLANT
SUT NOVA 1 T20/GS 25DT (SUTURE) IMPLANT
SUT PROLENE 2 0 KS (SUTURE) ×6 IMPLANT
TRAY FOL W/BAG SLVR 16FR STRL (SET/KITS/TRAYS/PACK) IMPLANT
TRAY FOLEY W/BAG SLVR 16FR LF (SET/KITS/TRAYS/PACK)
TRAY LAPAROSCOPIC (CUSTOM PROCEDURE TRAY) ×3 IMPLANT
TROCAR XCEL BLUNT TIP 100MML (ENDOMECHANICALS) IMPLANT
TROCAR XCEL NON-BLD 11X100MML (ENDOMECHANICALS) IMPLANT
TROCAR XCEL NON-BLD 5MMX100MML (ENDOMECHANICALS) ×3 IMPLANT
WATER STERILE IRR 1000ML POUR (IV SOLUTION) ×3 IMPLANT

## 2019-05-23 NOTE — Op Note (Signed)
05/23/2019  9:37 AM  PATIENT:  Justin English  63 y.o. male  PRE-OPERATIVE DIAGNOSIS:  UMBILICAL HERNIA  POST-OPERATIVE DIAGNOSIS:  UMBILICAL HERNIA  PROCEDURE:  Procedure(s): LAPAROSCOPIC UMBILICAL HERNIA REPAIR WITH MESH (N/A)  SURGEON:  Surgeon(s) and Role:    * Ralene Ok, MD - Primary  ANESTHESIA:   local and general  EBL:  10 mL   BLOOD ADMINISTERED:none  DRAINS: none   LOCAL MEDICATIONS USED:  BUPIVICAINE   SPECIMEN:  No Specimen  DISPOSITION OF SPECIMEN:  N/A  COUNTS:  YES  TOURNIQUET:  * No tourniquets in log *  DICTATION: .Dragon Dictation   Details of the procedure:   After the patient was consented patient was taken back to the operating room patient was then placed in supine position bilateral SCDs in place.  The patient was prepped and draped in the usual sterile fashion. After antibiotics were confirmed a timeout was called and all facts were verified. The Veress needle technique was used to insuflate the abdomen at Palmer's point. The abdomen was insufflated to 14 mm mercury. Subsequently a 5 mm trocar was placed a camera inserted there was no injury to any intra-abdominal organs.    There was seen to be an incarcerated  [id number] umbilical hernia.  A second camera port was in placed into the left lower quadrant.   At this the Falicform ligament was taken down with Bovie cautery maintaining hemostasis.    I proceeded to reduce the hernia contents.  The hernia sac was dissected out of the hernia and disposed.  The fascia at the hernia was reapproximated using a #1Novafil x 2.  Once the hernia was cleared away, a Bard Ventralight 15.2cm  mesh was inserted into the abdomen.  The mesh was secured circumferentially with am Securestrap tacker in a double crown fashion.  2-0 prolenes were used at 3:00, 6:00, 9:00, and 12:00 as transfascial sutures using a endoclose decive.    The omentum was brought over the area of the mesh. The pneumoperitoneum was evacuated   & all trocars  were removed. The skin was reapproximated with 4-0  Monocryl sutures in a subcuticular fashion. The skin was dressed with Dermabond.  The patient was taken to the recovery room in stable condition.  Type of repair -primary suture & mesh  Mesh overlap - 6cm  Placement of mesh -  beneath fascia and into peritoneal cavity   PLAN OF CARE: Discharge to home after PACU  PATIENT DISPOSITION:  PACU - hemodynamically stable.   Delay start of Pharmacological VTE agent (>24hrs) due to surgical blood loss or risk of bleeding: not applicable

## 2019-05-23 NOTE — Anesthesia Procedure Notes (Signed)
Procedure Name: Intubation Date/Time: 05/23/2019 8:57 AM Performed by: Gerald Leitz, CRNA Pre-anesthesia Checklist: Patient identified, Patient being monitored, Timeout performed, Emergency Drugs available and Suction available Patient Re-evaluated:Patient Re-evaluated prior to induction Oxygen Delivery Method: Circle system utilized Preoxygenation: Pre-oxygenation with 100% oxygen Induction Type: IV induction Ventilation: Mask ventilation without difficulty Laryngoscope Size: Mac and 3 Grade View: Grade I Tube type: Oral Tube size: 7.5 mm Number of attempts: 1 Placement Confirmation: ETT inserted through vocal cords under direct vision,  positive ETCO2 and breath sounds checked- equal and bilateral Secured at: 21 cm Tube secured with: Tape Dental Injury: Teeth and Oropharynx as per pre-operative assessment

## 2019-05-23 NOTE — Anesthesia Postprocedure Evaluation (Signed)
Anesthesia Post Note  Patient: Justin English  Procedure(s) Performed: LAPAROSCOPIC UMBILICAL HERNIA REPAIR WITH MESH (N/A Abdomen)     Patient location during evaluation: PACU Anesthesia Type: General Level of consciousness: awake and alert Pain management: pain level controlled Vital Signs Assessment: post-procedure vital signs reviewed and stable Respiratory status: spontaneous breathing, nonlabored ventilation and respiratory function stable Cardiovascular status: blood pressure returned to baseline and stable Postop Assessment: no apparent nausea or vomiting Anesthetic complications: no    Last Vitals:  Vitals:   05/23/19 1000 05/23/19 1015  BP: 132/82 112/76  Pulse: 82 81  Resp: 18 19  Temp:    SpO2: 94% 92%    Last Pain:  Vitals:   05/23/19 1020  TempSrc:   PainSc: 7                  Elvera Almario A.

## 2019-05-23 NOTE — Discharge Instructions (Signed)
[facility] _______[facility]  HERNIA REPAIR: POST OP INSTRUCTIONS  Always review your discharge instruction sheet given to you by the facility where your surgery was performed. IF YOU HAVE DISABILITY OR FAMILY LEAVE FORMS, YOU MUST BRING THEM TO THE OFFICE FOR PROCESSING.   DO NOT GIVE THEM TO YOUR DOCTOR.  1. A  prescription for pain medication may be given to you upon discharge.  Take your pain medication as prescribed, if needed.  If narcotic pain medicine is not needed, then you may take acetaminophen (Tylenol) or ibuprofen (Advil) as needed. 2. Take your usually prescribed medications unless otherwise directed. If you need a refill on your pain medication, please contact your pharmacy.  They will contact our office to request authorization. Prescriptions will not be filled after 5 pm or on week-ends. 3. You should follow a light diet the first 24 hours after arrival home, such as soup and crackers, etc.  Be sure to include lots of fluids daily.  Resume your normal diet the day after surgery. 4.Most patients will experience some swelling and bruising around the umbilicus or in the groin and scrotum.  Ice packs and reclining will help.  Swelling and bruising can take several days to resolve.  6. It is common to experience some constipation if taking pain medication after surgery.  Increasing fluid intake and taking a stool softener (such as Colace) will usually help or prevent this problem from occurring.  A mild laxative (Milk of Magnesia or Miralax) should be taken according to package directions if there are no bowel movements after 48 hours. 7. Unless discharge instructions indicate otherwise, you may remove your bandages 24-48 hours after surgery, and you may shower at that time.  You may have steri-strips (small skin tapes) in place directly over the incision.  These strips should be left on the skin for 7-10 days.  If your surgeon used skin glue on the incision, you may shower in  24 hours.  The glue will flake off over the next 2-3 weeks.  Any sutures or staples will be removed at the office during your follow-up visit. 8. ACTIVITIES:  You may resume regular (light) daily activities beginning the next day--such as daily self-care, walking, climbing stairs--gradually increasing activities as tolerated.  You may have sexual intercourse when it is comfortable.  Refrain from any heavy lifting or straining until approved by your doctor.  a.You may drive when you are no longer taking prescription pain medication, you can comfortably wear a seatbelt, and you can safely maneuver your car and apply brakes. b.RETURN TO WORK:   _____________________________________________  9.You should see your doctor in the office for a follow-up appointment approximately 2-3 weeks after your surgery.  Make sure that you call for this appointment within a day or two after you arrive home to insure a convenient appointment time. 10.OTHER INSTRUCTIONS: _________________________    _____________________________________  WHEN TO CALL YOUR DOCTOR: 1. Fever over 101.0 2. Inability to urinate 3. Nausea and/or vomiting 4. Extreme swelling or bruising 5. Continued bleeding from incision. 6. Increased pain, redness, or drainage from the incision  The clinic staff is available to answer your questions during regular business hours.  Please don't hesitate to call and ask to speak to one of the nurses for clinical concerns.  If you have a medical emergency, go to the nearest emergency room or call 911.  A surgeon from [facility] is always on call at the hospital   [address]  [address] [place], [place], [place]  [address] ?  [address], [place], [place]   [address] [phone] ? [phone] ? FAX [phone] Web site: [email or web address]    Laparoscopic Ventral Hernia Repair, Care After This sheet gives you information about how to care for yourself after your  procedure. Your health care provider may also give you more specific instructions. If you have problems or questions, contact your health care provider. What can I expect after the procedure? After the procedure, it is common to have:  Pain, discomfort, or soreness. Follow these instructions at home: Incision care   Follow instructions from your health care provider about how to take care of your incision. Make sure you: ? Wash your hands with soap and water before you change your bandage (dressing) or before you touch your abdomen. If soap and water are not available, use hand sanitizer. ? Change your dressing as told by your health care provider. ? Leave stitches (sutures), skin glue, or adhesive strips in place. These skin closures may need to stay in place for 2 weeks or longer. If adhesive strip edges start to loosen and curl up, you may trim the loose edges. Do not remove adhesive strips completely unless your health care provider tells you to do that.  Check your incision area every day for signs of infection. Check for: ? Redness, swelling, or pain. ? Fluid or blood. ? Warmth. ? Pus or a bad smell. Bathing   Do not take baths, swim, or use a hot tub until your health care provider approves. Ask your health care provider if you can take showers. You may only be allowed to take sponge baths for bathing.  Keep your bandage (dressing) dry until your health care provider says it can be removed. Activity  Do not lift anything that is heavier than 10 lb (4.5 kg) until your health care provider approves.  Do not drive or use heavy machinery while taking prescription pain medicine. Ask your health care provider when it is safe for you to drive or use heavy machinery.  Do not drive for 24 hours if you were given a medicine to help you relax (sedative) during your procedure.  Rest as told by your health care provider. You may return to your normal activities when your health care  provider approves. General instructions  Take over-the-counter and prescription medicines only as told by your health care provider.  To prevent or treat constipation while you are taking prescription pain medicine, your health care provider may recommend that you: ? Take over-the-counter or prescription medicines. ? Eat foods that are high in fiber, such as fresh fruits and vegetables, whole grains, and beans. ? Limit foods that are high in fat and processed sugars, such as fried and sweet foods.  Drink enough fluid to keep your urine clear or pale yellow.  Hold a pillow over your abdomen when you cough or sneeze. This helps with pain.  Keep all follow-up visits as told by your health care provider. This is important. Contact a health care provider if:  You have: ? A fever or chills. ? Redness, swelling, or pain around your incision. ? Fluid or blood coming from your incision. ? Pus or a bad smell coming from your incision. ? Pain that gets worse or does not get better with medicine. ? Nausea or vomiting. ? A cough. ? Shortness of breath.  Your incision feels warm to the touch.  You have not had a bowel movement in  three days.  You are not able to urinate. Get help right away if:  You have severe pain in your abdomen.  You have persistent nausea and vomiting.  You have redness, warmth, or pain in your leg.  You have chest pain.  You have trouble breathing. Summary  After this procedure, it is common to have pain, discomfort, or soreness.  Follow instructions from your health care provider about how to take care of your incision.  Check your incision area every day for signs of infection. Report any signs of infection to your health care provider.  Keep all follow-up visits as told by your health care provider. This is important. This information is not intended to replace advice given to you by your health care provider. Make sure you discuss any questions you  have with your health care provider. Document Released: 05/03/2012 Document Revised: 04/29/2017 Document Reviewed: 01/07/2016 Elsevier Patient Education  [facility] Instructions  Activity: Get plenty of rest for the remainder of the day. A responsible individual must stay with you for 24 hours following the procedure.  For the next 24 hours, DO NOT: -Drive a car -Paediatric nurse -Drink alcoholic beverages -Take any medication unless instructed by your physician -Make any legal decisions or sign important papers.  Meals: Start with liquid foods such as gelatin or soup. Progress to regular foods as tolerated. Avoid greasy, spicy, heavy foods. If nausea and/or vomiting occur, drink only clear liquids until the nausea and/or vomiting subsides. Call your physician if vomiting continues.  Special Instructions/Symptoms: Your throat may feel dry or sore from the anesthesia or the breathing tube placed in your throat during surgery. If this causes discomfort, gargle with warm salt water. The discomfort should disappear within 24 hours.  If you had a scopolamine patch placed behind your ear for the management of post- operative nausea and/or vomiting:  1. The medication in the patch is effective for 72 hours, after which it should be removed.  Wrap patch in a tissue and discard in the trash. Wash hands thoroughly with soap and water. 2. You may remove the patch earlier than 72 hours if you experience unpleasant side effects which may include dry mouth, dizziness or visual disturbances. 3. Avoid touching the patch. Wash your hands with soap and water after contact with the patch.

## 2019-05-23 NOTE — Anesthesia Preprocedure Evaluation (Signed)
Anesthesia Evaluation  Patient identified by MRN, date of birth, ID band Patient awake    Reviewed: Allergy & Precautions, NPO status , Patient's Chart, lab work & pertinent test results  Airway Mallampati: II  TM Distance: >3 FB Neck ROM: Full    Dental  (+) Teeth Intact, Caps   Pulmonary shortness of breath and with exertion, asthma , former smoker,    Pulmonary exam normal breath sounds clear to auscultation       Cardiovascular hypertension, Normal cardiovascular exam Rhythm:Regular Rate:Normal     Neuro/Psych Anxiety negative neurological ROS     GI/Hepatic Neg liver ROS, hiatal hernia, GERD  Medicated and Controlled,  Endo/Other  negative endocrine ROS  Renal/GU negative Renal ROS  negative genitourinary   Musculoskeletal Umbilical hernia   Abdominal   Peds  Hematology negative hematology ROS (+)   Anesthesia Other Findings   Reproductive/Obstetrics                             Anesthesia Physical Anesthesia Plan  ASA: II  Anesthesia Plan: General   Post-op Pain Management:    Induction: Intravenous  PONV Risk Score and Plan:   Airway Management Planned: LMA and Oral ETT  Additional Equipment:   Intra-op Plan:   Post-operative Plan: Extubation in OR  Informed Consent: I have reviewed the patients History and Physical, chart, labs and discussed the procedure including the risks, benefits and alternatives for the proposed anesthesia with the patient or authorized representative who has indicated his/her understanding and acceptance.     Dental advisory given  Plan Discussed with: CRNA and Surgeon  Anesthesia Plan Comments:         Anesthesia Quick Evaluation

## 2019-05-23 NOTE — Transfer of Care (Signed)
Immediate Anesthesia Transfer of Care Note  Patient: Justin English  Procedure(s) Performed: Procedure(s): LAPAROSCOPIC UMBILICAL HERNIA REPAIR WITH MESH (N/A)  Patient Location: PACU  Anesthesia Type:General  Level of Consciousness: Alert, Awake, Oriented  Airway & Oxygen Therapy: Patient Spontanous Breathing  Post-op Assessment: Report given to RN  Post vital signs: Reviewed and stable  Last Vitals:  Vitals:   05/23/19 0734 05/23/19 0945  BP: 136/88   Pulse: 90   Resp: 18   Temp: 36.8 C (P) 36.8 C  SpO2: 123456     Complications: No apparent anesthesia complications

## 2019-05-23 NOTE — H&P (Signed)
History of Present Illness  The patient is a 63 year old male who presents with an umbilical hernia.  Chief Complaint: Umbilical hernia  Patient is a 63 year old male with a several year history of an umbilical hernia. Patient states this is getting larger. Patient states he used to work with [employer] and states that this is likely where this hernia came from. He's had a previous undescended testicle the left side as well as left inguinal hernia had been repaired in the past. Patient states that the umbilical hernia does sometimes get tender and becomes painful. He states that every now that it is red. He states he's to be able to reduce it however recently he states he gained some weight and feels that it cannot be reduced as much.  Past Surgical History  Open Inguinal Hernia Surgery Left.  Diagnostic Studies History Colonoscopy within last year  Allergies No Known Drug Allergies [04/17/2019]:  Allergies Reconciled  Medication History   Pantoprazole Sodium (40MG  Tablet DR, Oral) Active.  Albuterol Sulfate HFA (108 (90 Base)MCG/ACT Aerosol Soln, Inhalation) Active.  Chlorpheniramine Maleate (4MG  Tablet, Oral) Active.  Metoprolol Succinate ER (25MG  Tablet ER 24HR, Oral) Active.  Montelukast Sodium (10MG  Tablet, Oral) Active.  DULoxetine HCl (60MG  Capsule DR Part, Oral) Active.  Fluticasone Propionate (50MCG/ACT Suspension, Nasal) Active.  Medications Reconciled  Social History  Alcohol use Occasional alcohol use.  Caffeine use Coffee.  No drug use  Tobacco use Former smoker.  Other Problems  Anxiety Disorder  Diverticulosis  Gastroesophageal Reflux Disease  Hemorrhoids  Inguinal Hernia  Review of Systems   General Not Present- Appetite Loss, Chills, Fatigue, Fever, Night Sweats, Weight Gain and Weight Loss.  Skin Not Present- Change in Wart/Mole, Dryness, Hives, Jaundice, New Lesions, Non-Healing Wounds, Rash and Ulcer.  HEENT Present- Seasonal Allergies and Sinus Pain. Not  Present- Earache, Hearing Loss, Hoarseness, Nose Bleed, Oral Ulcers, Ringing in the Ears, Sore Throat, Visual Disturbances, Wears glasses/contact lenses and Yellow Eyes.  Respiratory Not Present- Bloody sputum, Chronic Cough, Difficulty Breathing, Snoring and Wheezing.  Breast Not Present- Breast Mass, Breast Pain, Nipple Discharge and Skin Changes.  Cardiovascular Not Present- Chest Pain, Difficulty Breathing Lying Down, Leg Cramps, Palpitations, Rapid Heart Rate, Shortness of Breath and Swelling of Extremities.  Gastrointestinal Present- Hemorrhoids and Indigestion. Not Present- Abdominal Pain, Bloating, Bloody Stool, Change in Bowel Habits, Chronic diarrhea, Constipation, Difficulty Swallowing, Excessive gas, Gets full quickly at meals, Nausea, Rectal Pain and Vomiting.  Male Genitourinary Not Present- Blood in Urine, Change in Urinary Stream, Frequency, Impotence, Nocturia, Painful Urination, Urgency and Urine Leakage.  Musculoskeletal Not Present- Back Pain, Joint Pain, Joint Stiffness, Muscle Pain, Muscle Weakness and Swelling of Extremities.  Neurological Not Present- Decreased Memory, Fainting, Headaches, Numbness, Seizures, Tingling, Tremor, Trouble walking and Weakness.  Psychiatric Present- Anxiety. Not Present- Bipolar, Change in Sleep Pattern, Depression, Fearful and Frequent crying.  Endocrine Present- Cold Intolerance. Not Present- Excessive Hunger, Hair Changes, Heat Intolerance and New Diabetes.  Hematology Not Present- Blood Thinners, Easy Bruising, Excessive bleeding, Gland problems, HIV and Persistent Infections.  All other systems negative   BP 136/88   Pulse 90   Temp 98.2 F (36.8 C) (Oral)   Resp 18   Ht 6\' 2"  (1.88 m)   Wt 103.2 kg   SpO2 99%   BMI 29.21 kg/m    Physical Exam  The physical exam findings are as follows:  Note: Constitutional: No acute distress, conversant, appears stated age  Eyes: Anicteric sclerae, moist conjunctiva, no  lid lag  Neck: No  thyromegaly, trachea midline, no cervical lymphadenopathy  Lungs: Clear to auscultation biilaterally, normal respiratory effot  Cardiovascular: regular rate & rhythm, no murmurs, no peripheal edema, pedal pulses 2+  GI: Soft, no masses or hepatosplenomegaly, non-tender to palpation  MSK: Normal gait, no clubbing cyanosis, edema  Skin: No rashes, palpation reveals normal skin turgor  Psychiatric: Appropriate judgment and insight, oriented to person, place, and time  Abdomen  Inspection  Hernias - Umbilical hernia - Incarcerated.    Assessment & Plan  UMBILICAL HERNIA WITHOUT OBSTRUCTION AND WITHOUT GANGRENE (K42.9)  Impression: 63 year old male with a primary umbilical hernia.  1. The patient will like to proceed to the operating room for laparoscopic umbilical hernia repair with mesh.  2. I discussed with the patient the signs and symptoms of incarceration and strangulation and the need to proceed to the ER should they occur.  3. I discussed with the patient the risks and benefits of the procedure to include but not limited to: Infection, bleeding, damage to surrounding structures, possible need for further surgery, possible nerve pain, and possible recurrence. The patient was understanding and wishes to proceed.

## 2019-06-07 IMAGING — CT CT CHEST W/ CM
2 of 3 series · 15 of 36 positions shown, 18 images · IV contrast (iopamidol)
Comparison: Prior radiograph from earlier the same day.

CLINICAL DATA: Initial evaluation for acute shortness of breath.
History of reflux disease.

EXAM:
CT CHEST WITH CONTRAST
TECHNIQUE: Multidetector CT imaging of the chest was performed during
intravenous contrast administration.
CONTRAST:  100mL 8R00EH-CBB IOPAMIDOL (8R00EH-CBB) INJECTION 61%

[Series 2: axial st · axial · 0.78mm/px · z∈[-252,-8]mm · 12 of 144 slices shown, 15 images]
[im 11/144  mediastinal]
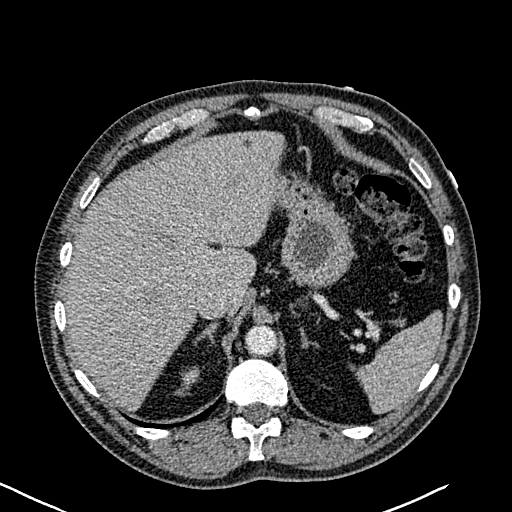
[im 11/144  lung]
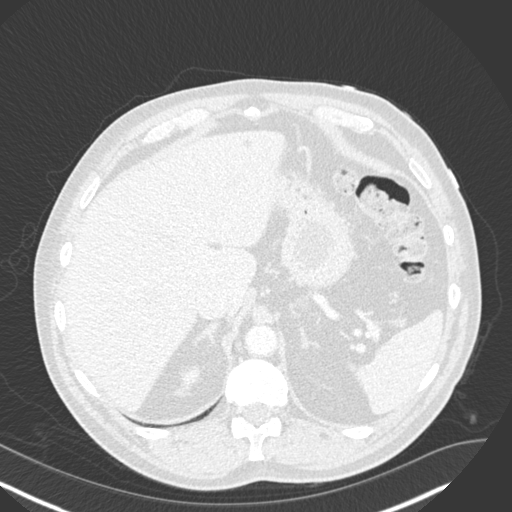
[im 22/144  lung]
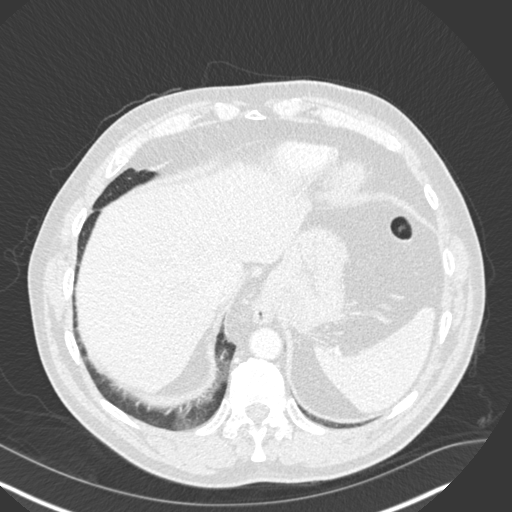
[im 32/144  lung]
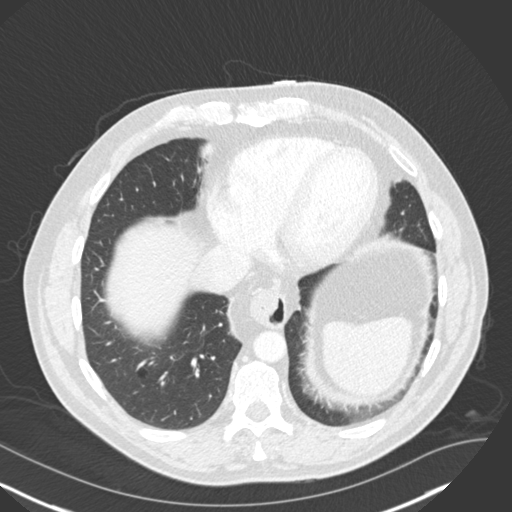
[im 43/144  lung]
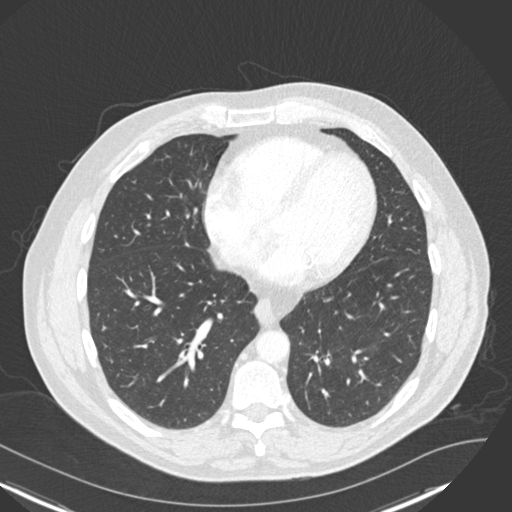
[im 53/144  mediastinal]
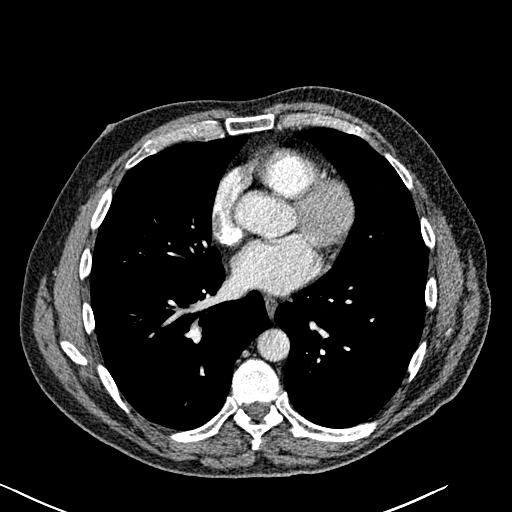
[im 53/144  lung]
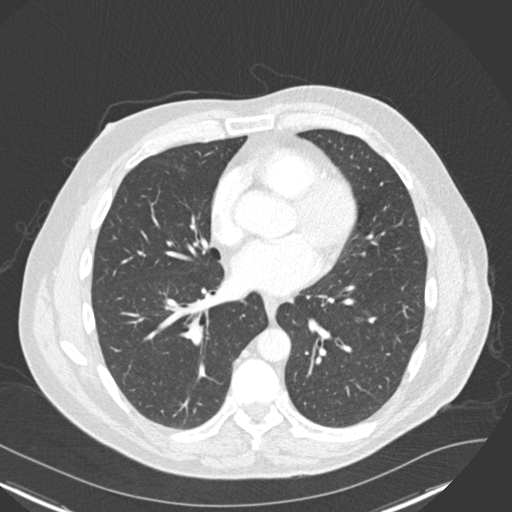
[im 64/144  lung]
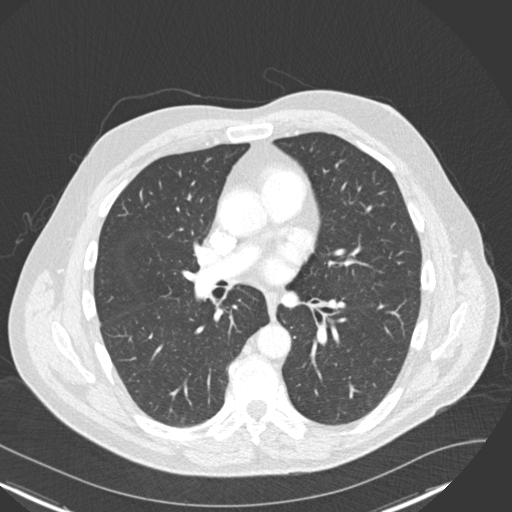
[im 80/144  lung]
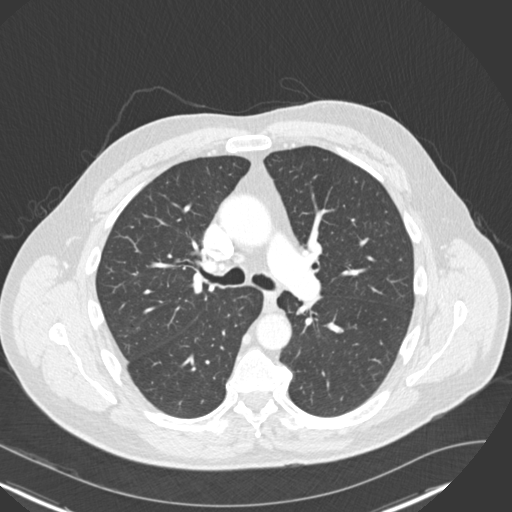
[im 91/144  lung]
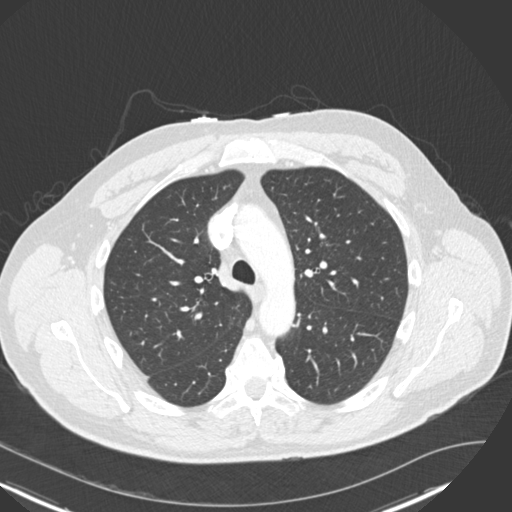
[im 101/144  mediastinal]
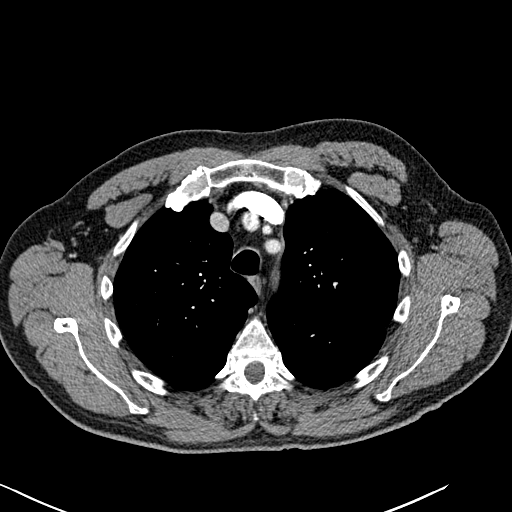
[im 101/144  lung]
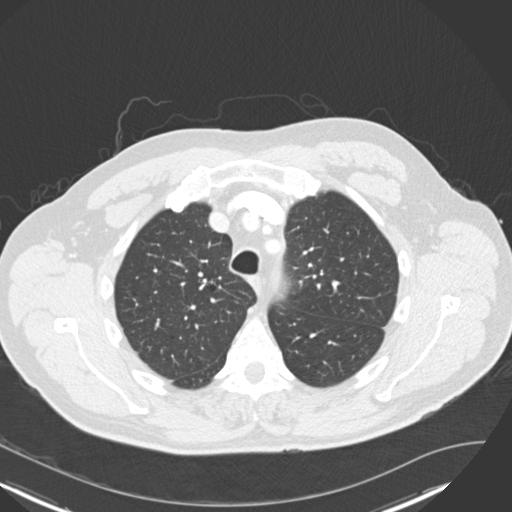
[im 112/144  lung]
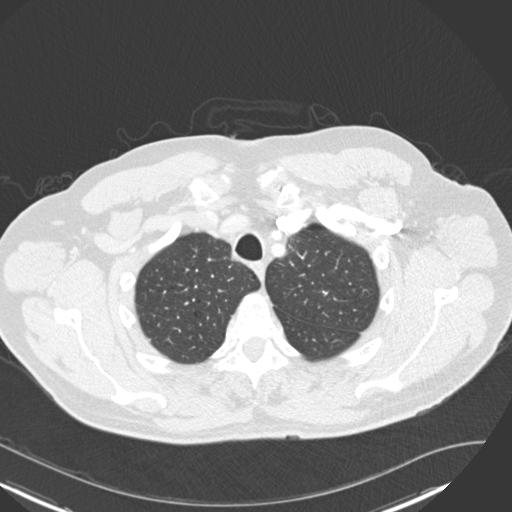
[im 122/144  lung]
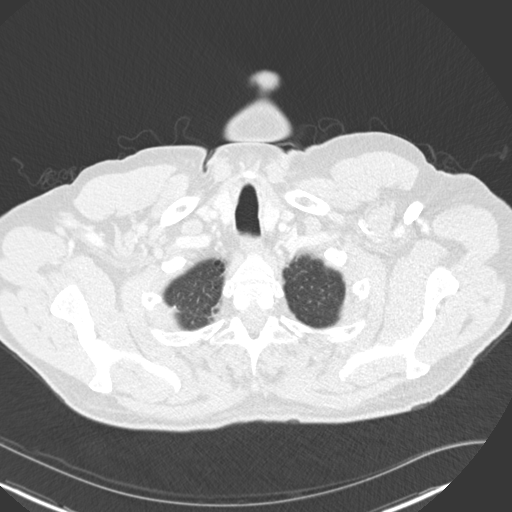
[im 133/144  lung]
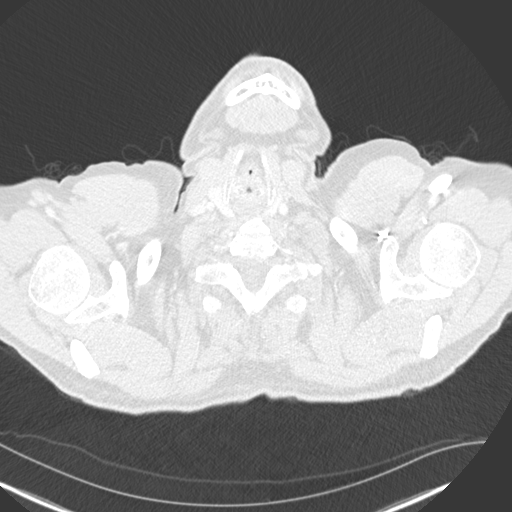

[Series 5: coronal · coronal · 0.61mm/px · 3 of 152 slices shown]
[im 31/152  lung]
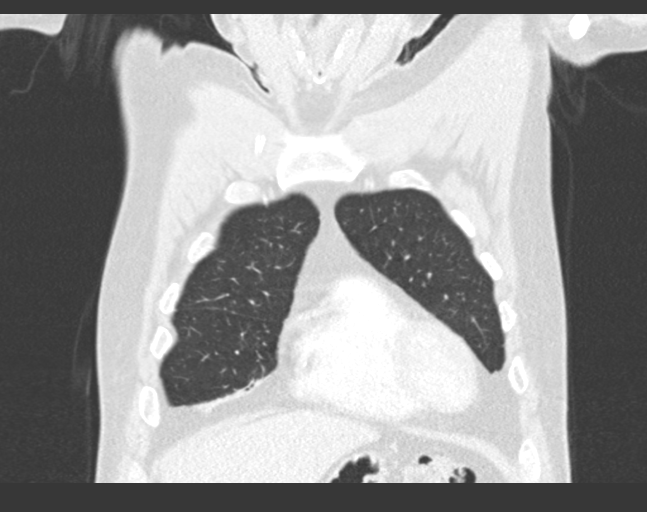
[im 61/152  lung]
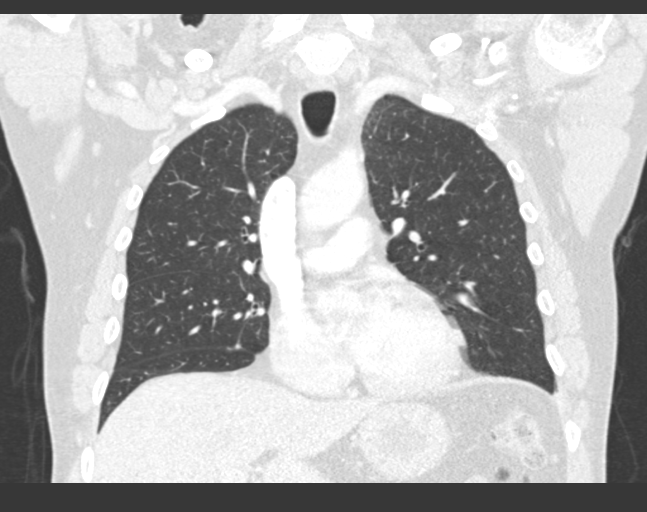
[im 91/152  lung]
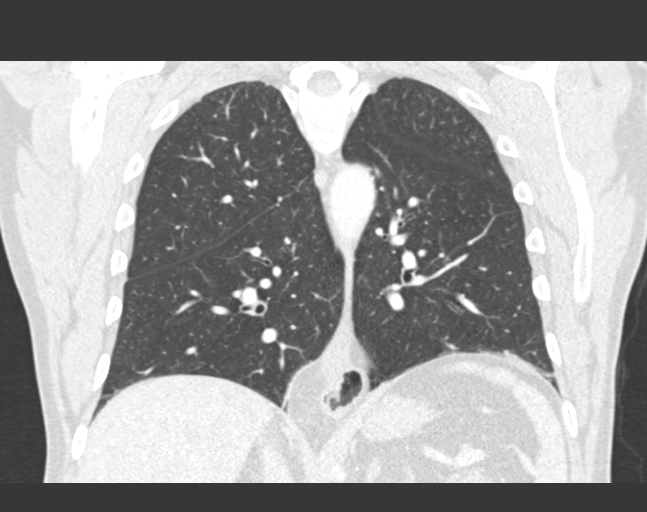

[15 of 36 positions shown; findings below may reference images not displayed]

FINDINGS: Cardiovascular: Intrathoracic aorta normal in caliber without
aneurysm or other acute finding. Mild scattered atheromatous plaque
within the aortic arch. Visualized great vessels within normal
limits. Heart size normal. No pericardial effusion. Prominent
coronary artery calcifications noted within the LAD. Limited
evaluation of the pulmonary arterial tree grossly unremarkable.

Mediastinum/Nodes: Visualized thyroid within normal limits. No
pathologically enlarged mediastinal, hilar or axillary lymph nodes.
Esophagus within normal limits. Small hiatal hernia noted.

Lungs/Pleura: Tracheal air column intact and widely patent. Lungs
well inflated bilaterally. Minimal subsegmental atelectatic changes
seen dependently within the lower lobes bilaterally. Mild
centrilobular emphysema at the bilateral lung apices. No focal
infiltrates. No pulmonary edema or pleural effusion. No
pneumothorax. No worrisome pulmonary nodule or mass.

Upper Abdomen: Subcentimeter hypodensity within left hepatic lobe
noted, too small the characterize, but may reflect a small cyst.
Visualized upper abdomen otherwise unremarkable.

Musculoskeletal: External soft tissues demonstrate no acute finding.
No acute osseous abnormality. No discrete lytic or blastic osseous
lesions.
IMPRESSION: 1. No CT evidence for acute cardiopulmonary abnormality.
2. Mild upper lobe centrilobular emphysema.
3. Small hiatal hernia.
4. Mild aortic atherosclerosis with prominent coronary artery
calcifications within the LAD.

## 2019-06-07 IMAGING — CR DG CHEST 2V
2 series · 2 of 2 positions shown · non-contrast
Comparison: 08/15/2016

CLINICAL DATA: Pt reports that he has been having non-stop burping
pt states it has been making him feel SOB, and some Dizziness. Pt
has hx of acid reflux worsening the past week. Pt HX: asthma, ex
smokerChest discomfort

EXAM:
CHEST - 2 VIEW

[w chest pa]
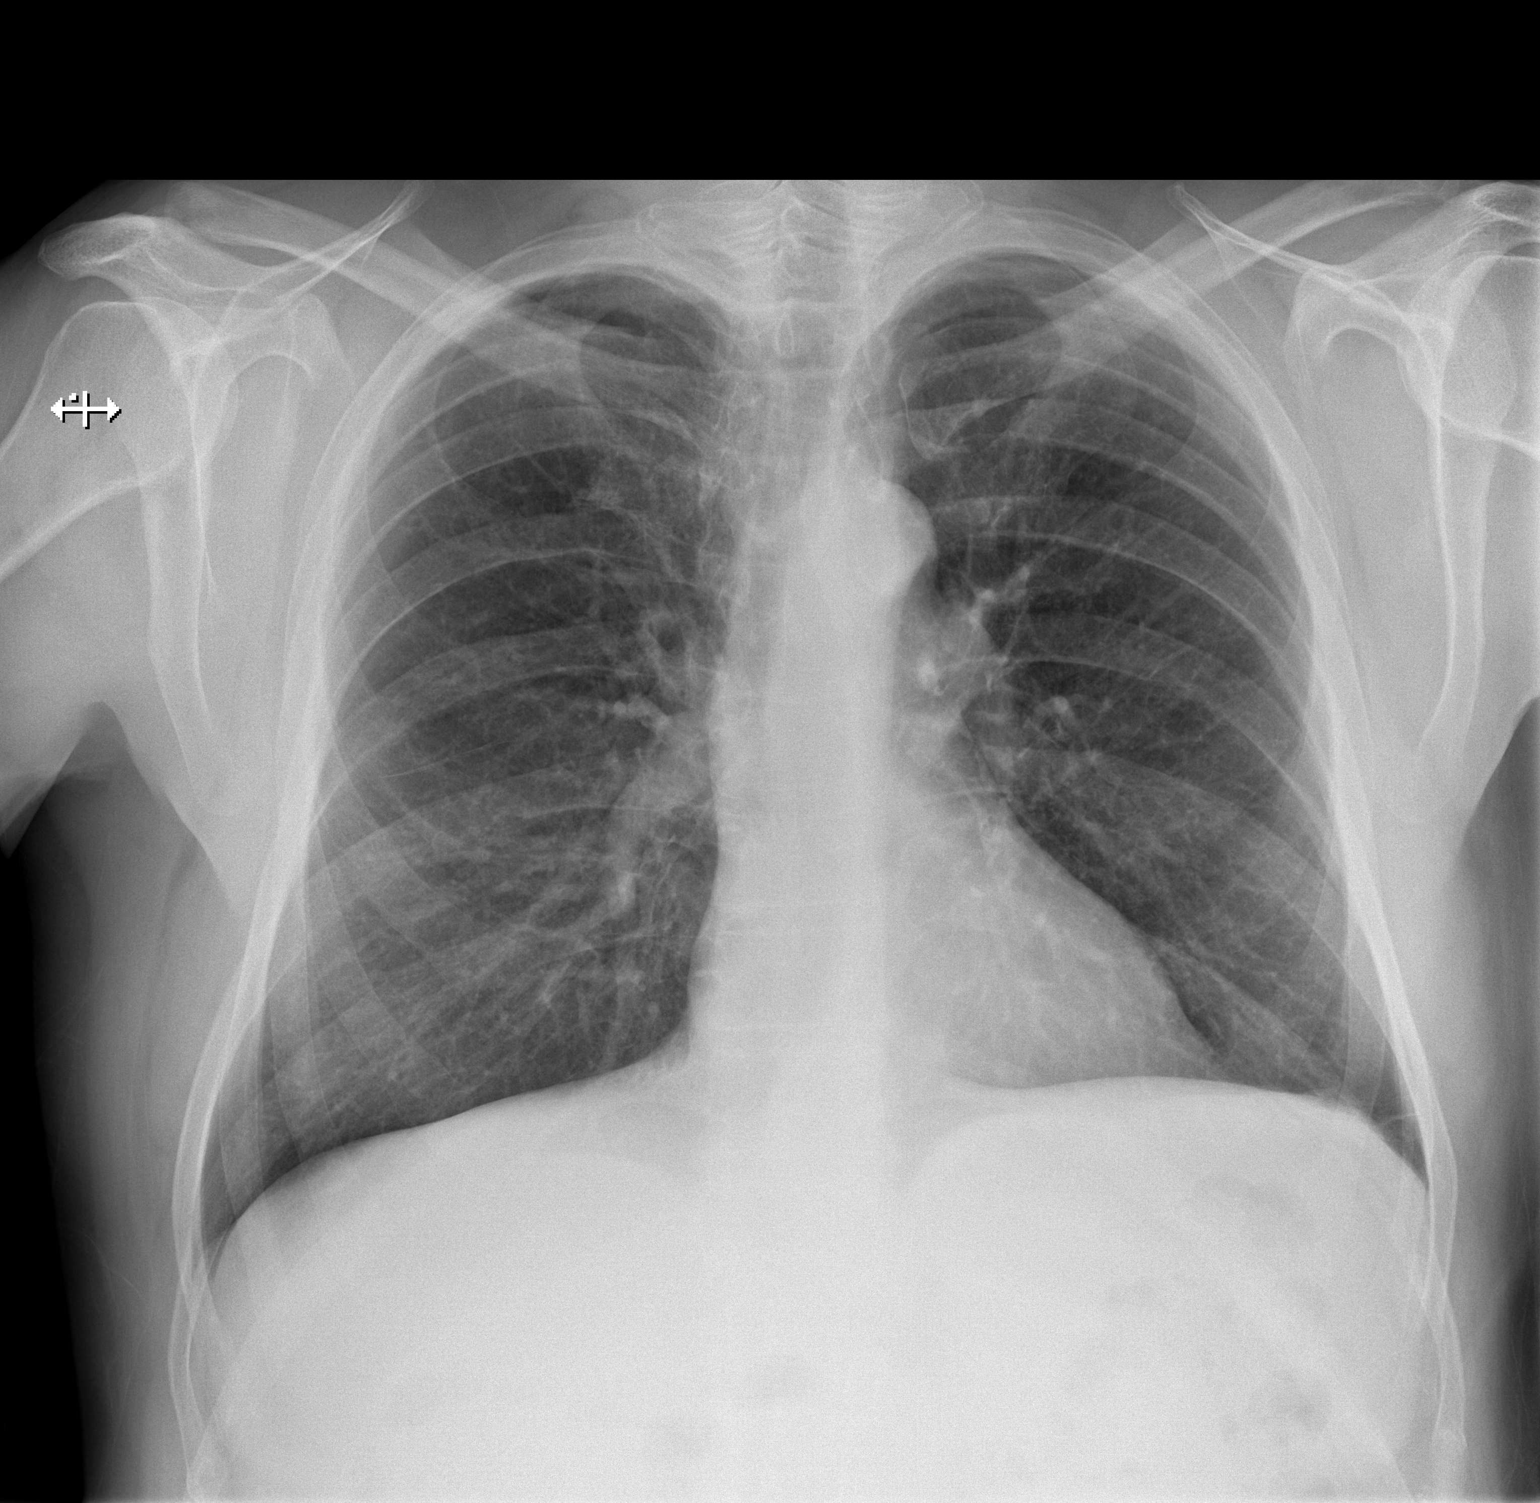

[w chest lat]
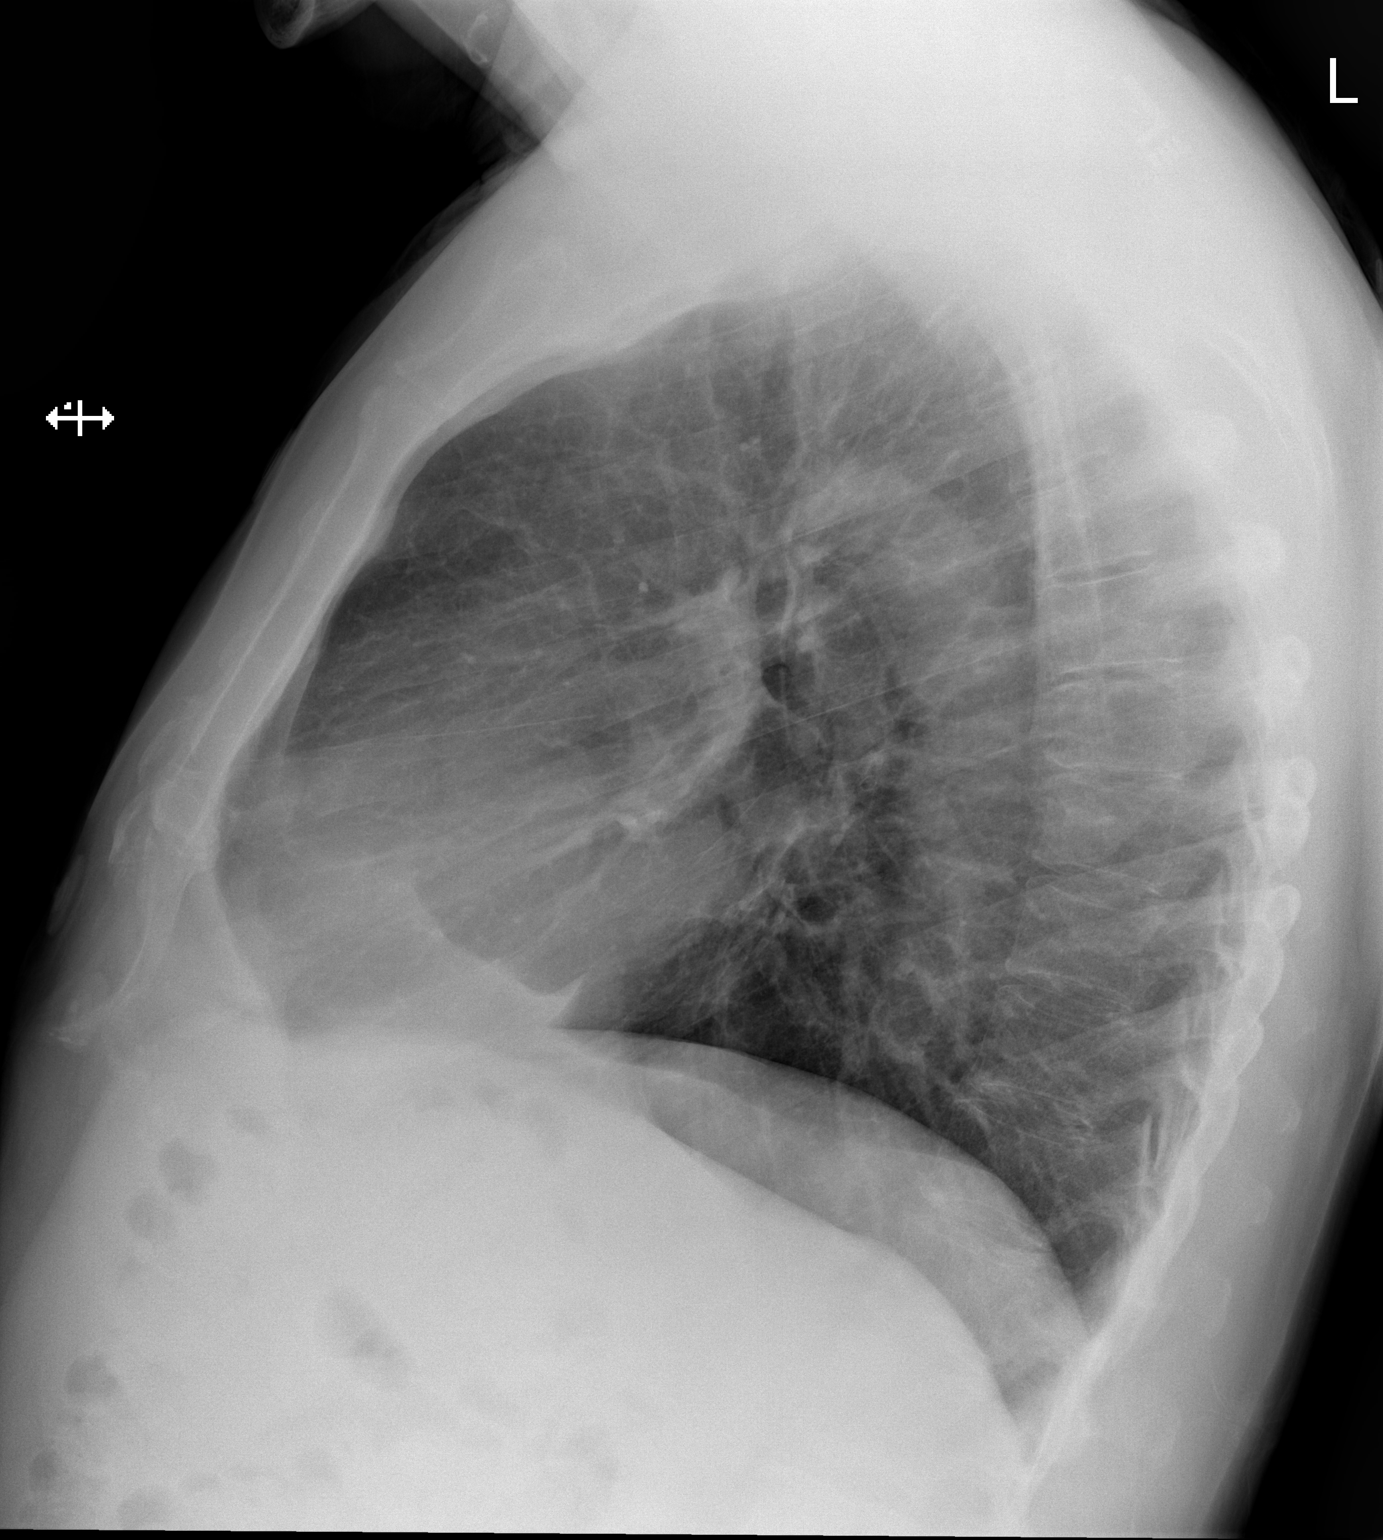

[2 of 2 positions shown; findings below may reference images not displayed]

FINDINGS: Normal mediastinum and cardiac silhouette. Normal pulmonary
vasculature. No evidence of effusion, infiltrate, or pneumothorax.
No acute bony abnormality.
IMPRESSION: No acute cardiopulmonary process.

## 2019-08-10 ENCOUNTER — Other Ambulatory Visit: Payer: Self-pay | Admitting: Cardiology

## 2019-08-24 ENCOUNTER — Ambulatory Visit: Payer: 59 | Attending: Internal Medicine

## 2019-08-24 DIAGNOSIS — Z23 Encounter for immunization: Secondary | ICD-10-CM

## 2019-08-24 NOTE — Progress Notes (Signed)
   Covid-19 Vaccination Clinic  Name:  Justin English    MRN: [id number] DOB: [birth year]  08/24/2019  [name] was observed post Covid-19 immunization for 15 minutes without incident. He was provided with Vaccine Information Sheet and instruction to access the V-Safe system.   [name] was instructed to call 911 with any severe reactions post vaccine: Marland Kitchen Difficulty breathing  . Swelling of face and throat  . A fast heartbeat  . A bad rash all over body  . Dizziness and weakness   Immunizations Administered    Name Date Dose VIS Date Route   Moderna COVID-19 Vaccine 08/24/2019 10:18 AM 0.5 mL 05/01/2019 Intramuscular   Manufacturer: Moderna   Lot: [id number]   [id number]

## 2019-09-01 ENCOUNTER — Other Ambulatory Visit: Payer: Self-pay | Admitting: Gastroenterology

## 2019-09-26 ENCOUNTER — Ambulatory Visit: Payer: 59 | Attending: Internal Medicine

## 2019-09-26 DIAGNOSIS — Z23 Encounter for immunization: Secondary | ICD-10-CM

## 2019-09-26 NOTE — Progress Notes (Signed)
   Covid-19 Vaccination Clinic  Name:  JORJE CAPE    MRN: [id number] DOB: [birth year]  09/26/2019  Mr. Kolden was observed post Covid-19 immunization for 15 minutes without incident. He was provided with Vaccine Information Sheet and instruction to access the V-Safe system.   Mr. Duplantier was instructed to call 911 with any severe reactions post vaccine: Marland Kitchen Difficulty breathing  . Swelling of face and throat  . A fast heartbeat  . A bad rash all over body  . Dizziness and weakness   Immunizations Administered    Name Date Dose VIS Date Route   Moderna COVID-19 Vaccine 09/26/2019  9:10 AM 0.5 mL 05/2019 Intramuscular   Manufacturer: Moderna   Lot: [id number]   [id number]

## 2019-10-10 ENCOUNTER — Other Ambulatory Visit: Payer: Self-pay | Admitting: Gastroenterology

## 2019-11-16 ENCOUNTER — Other Ambulatory Visit: Payer: Self-pay | Admitting: Gastroenterology

## 2020-04-01 ENCOUNTER — Other Ambulatory Visit: Payer: Self-pay | Admitting: Gastroenterology

## 2020-05-03 ENCOUNTER — Other Ambulatory Visit: Payer: Self-pay | Admitting: Gastroenterology

## 2020-06-04 ENCOUNTER — Other Ambulatory Visit: Payer: Self-pay | Admitting: Gastroenterology

## 2020-07-12 ENCOUNTER — Other Ambulatory Visit: Payer: Self-pay | Admitting: Gastroenterology

## 2020-08-14 ENCOUNTER — Other Ambulatory Visit: Payer: Self-pay

## 2020-08-14 MED ORDER — PANTOPRAZOLE SODIUM 40 MG PO TBEC
DELAYED_RELEASE_TABLET | ORAL | 0 refills | Status: DC
Start: 2020-08-14 — End: 2020-09-19

## 2020-09-19 ENCOUNTER — Other Ambulatory Visit: Payer: Self-pay | Admitting: Gastroenterology

## 2020-10-09 ENCOUNTER — Encounter: Payer: Self-pay | Admitting: Gastroenterology

## 2020-10-09 ENCOUNTER — Ambulatory Visit (INDEPENDENT_AMBULATORY_CARE_PROVIDER_SITE_OTHER): Payer: 59 | Admitting: Gastroenterology

## 2020-10-09 VITALS — BP 132/86 | HR 106 | Ht 74.0 in | Wt 242.4 lb

## 2020-10-09 DIAGNOSIS — K429 Umbilical hernia without obstruction or gangrene: Secondary | ICD-10-CM | POA: Diagnosis not present

## 2020-10-09 DIAGNOSIS — K219 Gastro-esophageal reflux disease without esophagitis: Secondary | ICD-10-CM

## 2020-10-09 MED ORDER — PANTOPRAZOLE SODIUM 40 MG PO TBEC: DELAYED_RELEASE_TABLET | ORAL | 4 refills | Status: DC

## 2020-10-09 NOTE — Patient Instructions (Signed)
We have sent the following medications to your pharmacy for you to pick up at your convenience: Pantoprazole   Follow-up in 1 year.   If you are age 63 or older, your body mass index should be between 23-30. Your Body mass index is 31.12 kg/m. If this is out of the aforementioned range listed, please consider follow up with your Primary Care Provider.  If you are age 81 or younger, your body mass index should be between 19-25. Your Body mass index is 31.12 kg/m. If this is out of the aformentioned range listed, please consider follow up with your Primary Care Provider.    Thank you for choosing me and [facility].  Dr. Rush Landmark

## 2020-10-09 NOTE — Progress Notes (Signed)
[place] VISIT   Primary Care Provider Everardo Beals, NP [place] [id number] [phone]  Patient Profile: Justin English is a 65 y.o. male with a pmh significant for Asthma, Anxiety, GERD, hiatal hernia, umbilical hernia (status post treatment).  The patient presents to the [facility] for an evaluation and management of problem(s) noted below:  Problem List 1. Gastroesophageal reflux disease, unspecified whether esophagitis present   2. Umbilical hernia without obstruction and without gangrene     History of Present Illness: Please see initial consultation note and prior progress notes for full details of HPI.    Interval History Today, the patient returns for scheduled follow-up.  We have not seen him in over a year.  He is doing well.  As long as he takes his medication his GERD symptoms are well controlled.  He is not having any significant issues of chest pain or chest discomfort.  No dysphagia symptoms at this time.  He had undergone an umbilical hernia repair in 2020 by Dr. Rosendo Gros and feels that has made a significant difference in his life as well.  He believes we may have referred him for this though I do not have documentation of that in the chart.   GI Review of Systems Positive as above Negative for odynophagia, nausea, vomiting, alteration in bowel habits, melena or hematochezia  Review of Systems General: Denies fevers/chills/unintentional weight loss Cardiovascular: Denies current chest pain Pulmonary: Denies shortness of breath Gastroenterological: See HPI Genitourinary: Denies darkened urine Hematological: Denies easy bruising Dermatological: Denies jaundice Psychological: Mood is stable   Medications Current Outpatient Medications  Medication Sig Dispense Refill  . albuterol (PROVENTIL HFA;VENTOLIN HFA) 108 (90 Base) MCG/ACT inhaler Inhale 1-2 puffs into the lungs every 6 (six)  hours as needed for wheezing or shortness of breath. 1 Inhaler 0  . cetirizine (ZYRTEC) 10 MG tablet Take 10 mg by mouth daily as needed for allergies.    . diphenhydrAMINE HCl (BENADRYL ALLERGY PO) Take 1 tablet by mouth daily as needed.    . DULoxetine (CYMBALTA) 60 MG capsule Take 60 mg by mouth at bedtime.     . fluticasone (FLONASE) 50 MCG/ACT nasal spray Place 1 spray into both nostrils daily.    . metoprolol succinate (TOPROL-XL) 25 MG 24 hr tablet Take 1 tablet (25 mg total) by mouth daily. Pt must make appt with provider for further refills - 1st attempt 30 tablet 0  . montelukast (SINGULAIR) 10 MG tablet Take 10 mg by mouth at bedtime.    Marland Kitchen QVAR 80 MCG/ACT inhaler Inhale 2 puffs into the lungs daily as needed (wheezing or shortness of breath).     . pantoprazole (PROTONIX) 40 MG tablet TAKE ONE TABLET TWICE DAILY 60 tablet 4   No current facility-administered medications for this visit.    Allergies Allergies  Allergen Reactions  . Zithromax [Azithromycin] Hives and Itching    ZPAC/ caused hives    Histories Past Medical History:  Diagnosis Date  . Allergy    seasonal  . Anxiety   . Asthma    on recent inhaler use  . Bronchitis   . Diverticulitis    2019 per colonscopy  . GERD (gastroesophageal reflux disease)   . Hemorrhoids    none recent  . Hiatal hernia   . Hypertension   . Sinus infection    hx of frequent sinus infection   Past Surgical History:  Procedure Laterality Date  . colonsscopy  05/23/2018   and endoscopy  . INGUINAL HERNIA REPAIR Left    was 65 years old and 15 yrs ago  . LEFT HEART CATH AND CORONARY ANGIOGRAPHY N/A 06/19/2018   Procedure: LEFT HEART CATH AND CORONARY ANGIOGRAPHY;  Surgeon: Lorretta Harp, MD;  Location: [facility];  Service: Cardiovascular;  Laterality: N/A;  . UMBILICAL HERNIA REPAIR N/A 05/23/2019   Procedure: LAPAROSCOPIC UMBILICAL HERNIA REPAIR WITH MESH;  Surgeon: Ralene Ok, MD;  Location: [facility];  Service: General;  Laterality: N/A;   Social History   Socioeconomic History  . Marital status: Married    Spouse name: Not on file  . Number of children: 3  . Years of education: Not on file  . Highest education level: Not on file  Occupational History  . Occupation: color tech  Tobacco Use  . Smoking status: Former Smoker    Types: Cigarettes    Quit date: 03/01/2015    Years since quitting: 5.6  . Smokeless tobacco: Never Used  Vaping Use  . Vaping Use: Never used  Substance and Sexual Activity  . Alcohol use: Not Currently  . Drug use: No  . Sexual activity: Not on file  Other Topics Concern  . Not on file  Social History Narrative  . Not on file   Social Determinants of Health   Financial Resource Strain: Not on file  Food Insecurity: Not on file  Transportation Needs: Not on file  Physical Activity: Not on file  Stress: Not on file  Social Connections: Not on file  Intimate Partner Violence: Not on file   Family History  Problem Relation Age of Onset  . Bronchitis Mother   . Prostate cancer Father   . Lung cancer Father   . Arthritis Brother   . Colon cancer Neg Hx   . Esophageal cancer Neg Hx   . Inflammatory bowel disease Neg Hx   . Liver disease Neg Hx   . Pancreatic cancer Neg Hx   . Rectal cancer Neg Hx   . Stomach cancer Neg Hx    I have reviewed his medical, social, and family history in detail and updated the electronic medical record as necessary.    PHYSICAL EXAMINATION  BP 132/86 (BP Location: Left Arm, Patient Position: Sitting, Cuff Size: Large)   Pulse (!) 106   Ht 6\' 2"  (1.88 m)   Wt 242 lb 6 oz (109.9 kg)   BMI 31.12 kg/m  Wt Readings from Last 3 Encounters:  10/09/20 242 lb 6 oz (109.9 kg)  05/23/19 227 lb 8 oz (103.2 kg)  07/18/18 210 lb 2 oz (95.3 kg)  GEN: NAD, appears stated age, doesn't appear chronically ill PSYCH: Cooperative, without pressured speech EYE: Conjunctivae pink, sclerae anicteric ENT:  Masked CV: Nontachycardic RESP: No audible wheezing GI: NABS, soft, protuberant abdomen, umbilical hernia no longer visualized, nontender, without rebound MSK/EXT: No lower extremity edema  SKIN: No jaundice NEURO:  Alert & Oriented x 3, no focal deficits   REVIEW OF DATA  I reviewed the following data at the time of this encounter:  GI Procedures and Studies  Previously reviewed  Laboratory Studies  Reviewed in epic  Imaging Studies  No relevant studies   ASSESSMENT  Mr. Colvin is a 65 y.o. male with a pmh significant for Asthma, Anxiety, GERD, hiatal hernia, umbilical hernia (status post treatment).  The patient is seen today for evaluation and management of:  1. Gastroesophageal reflux disease, unspecified whether esophagitis  present   2. Umbilical hernia without obstruction and without gangrene    The patient is clinically hemodynamically stable.  He is doing well on his current PPI dosing.  We discussed briefly consideration of in the future if he wanted to come off PPI therapy considerations for treatment in regards to fundoplication or LINX or fundoplication via endoscopy.  Patient is okay with current status and is going to maintain his current PPI regimen.  If the patient develops symptoms of recurrent dysphagia we will consider Schatzki ring disruption/dilation.  He will be due for colonoscopy in 2024.  Asked today if I could fill out forms documenting his previous referral for umbilical hernia repair.  At this time I have reviewed the chart and although it seems he had the surgery and was performed by Dr. Rosendo Gros and has had good impact, I was not the provider who referred him.  I believe it most likely was his primary care provider but I asked him to reach out to Dr. Ramirez's office and see who placed the referral so that person or Dr. Rosendo Gros may be able to further fill out any paperwork that he has to help him.  All patient questions were answered to the best of my ability,  and the patient agrees to the aforementioned plan of action with follow-up as indicated.   PLAN  Follow-up in 1 year Continue Protonix 40 twice daily if necessary but can consider decreasing back to once daily and maintained thereafter If recurrent issues of dysphagia patient to let us know and we will proceed with a dilation/disruption of Schatzki ring via EGD Colonoscopy in 2024 Gas-X/simethicone as needed for bloating If issues persist consider SIBO/EPI evaluation Follow-up with Dr. Rosendo Gros office in regards to who placed initial referral   No orders of the defined types were placed in this encounter.   New Prescriptions   No medications on file   Modified Medications   Modified Medication Previous Medication   PANTOPRAZOLE (PROTONIX) 40 MG TABLET pantoprazole (PROTONIX) 40 MG tablet      TAKE ONE TABLET TWICE DAILY    TAKE ONE TABLET TWICE DAILY    Planned Follow Up: Return in about 1 year (around 10/09/2021).   Total Time in Face-to-Face and in Coordination of Care for patient including independent/personal interpretation/review of prior testing, medical history, examination, medication adjustment, communicating results with the patient directly, and documentation with the EHR is 25 minutes.   Justice Britain, MD [facility]oscopy Office # [phone]

## 2020-10-15 ENCOUNTER — Encounter: Payer: Self-pay | Admitting: Gastroenterology

## 2021-04-13 ENCOUNTER — Other Ambulatory Visit: Payer: Self-pay | Admitting: Gastroenterology

## 2021-10-03 ENCOUNTER — Other Ambulatory Visit: Payer: Self-pay | Admitting: Gastroenterology

## 2021-11-03 ENCOUNTER — Telehealth: Payer: Self-pay | Admitting: Pharmacy Technician

## 2021-11-03 NOTE — Telephone Encounter (Signed)
Patient Advocate Encounter  Received notification from [facility] that prior authorization for PANTOPRAZOLE '40MG'$  is required.   PA submitted on 6.6.23 Key [id number]  Status is pending   [facility] will continue to follow  Luciano Cutter, CPhT Patient Advocate Phone: [phone]

## 2021-11-07 ENCOUNTER — Other Ambulatory Visit (HOSPITAL_COMMUNITY): Payer: Self-pay

## 2021-11-07 NOTE — Telephone Encounter (Signed)
Received notification from [facility] regarding a prior authorization for PANTOPRAZOLE '40MG'$ . Authorization has been APPROVED from 6.6.23 to 6.5.24.    Authorization # [employer] [place] [id number]

## 2021-12-16 DIAGNOSIS — J45909 Unspecified asthma, uncomplicated: Secondary | ICD-10-CM | POA: Diagnosis not present

## 2021-12-17 DIAGNOSIS — I10 Essential (primary) hypertension: Secondary | ICD-10-CM | POA: Diagnosis not present

## 2021-12-17 DIAGNOSIS — R0600 Dyspnea, unspecified: Secondary | ICD-10-CM | POA: Diagnosis not present

## 2021-12-17 DIAGNOSIS — R69 Illness, unspecified: Secondary | ICD-10-CM | POA: Diagnosis not present

## 2021-12-18 DIAGNOSIS — R0600 Dyspnea, unspecified: Secondary | ICD-10-CM | POA: Diagnosis not present

## 2021-12-22 ENCOUNTER — Other Ambulatory Visit: Payer: Self-pay | Admitting: *Deleted

## 2021-12-22 DIAGNOSIS — R791 Abnormal coagulation profile: Secondary | ICD-10-CM

## 2021-12-24 ENCOUNTER — Other Ambulatory Visit: Payer: Self-pay

## 2021-12-24 ENCOUNTER — Inpatient Hospital Stay (HOSPITAL_COMMUNITY)
Admission: EM | Admit: 2021-12-24 | Discharge: 2021-12-26 | DRG: 176 | Disposition: A | Discharge status: Home / Self Care | Payer: 59 | Attending: Internal Medicine | Admitting: Internal Medicine

## 2021-12-24 ENCOUNTER — Emergency Department (HOSPITAL_COMMUNITY): Payer: 59

## 2021-12-24 ENCOUNTER — Encounter (HOSPITAL_COMMUNITY): Payer: Self-pay

## 2021-12-24 ENCOUNTER — Ambulatory Visit
Admission: RE | Admit: 2021-12-24 | Discharge: 2021-12-24 | Disposition: A | Discharge status: Home / Self Care | Payer: 59 | Source: Ambulatory Visit | Attending: *Deleted | Admitting: *Deleted

## 2021-12-24 ENCOUNTER — Inpatient Hospital Stay (HOSPITAL_COMMUNITY): Payer: 59

## 2021-12-24 DIAGNOSIS — J929 Pleural plaque without asbestos: Secondary | ICD-10-CM | POA: Diagnosis not present

## 2021-12-24 DIAGNOSIS — Z8042 Family history of malignant neoplasm of prostate: Secondary | ICD-10-CM | POA: Diagnosis not present

## 2021-12-24 DIAGNOSIS — J329 Chronic sinusitis, unspecified: Secondary | ICD-10-CM | POA: Diagnosis not present

## 2021-12-24 DIAGNOSIS — I2692 Saddle embolus of pulmonary artery without acute cor pulmonale: Principal | ICD-10-CM | POA: Diagnosis present

## 2021-12-24 DIAGNOSIS — I82433 Acute embolism and thrombosis of popliteal vein, bilateral: Secondary | ICD-10-CM | POA: Diagnosis present

## 2021-12-24 DIAGNOSIS — R109 Unspecified abdominal pain: Secondary | ICD-10-CM | POA: Diagnosis not present

## 2021-12-24 DIAGNOSIS — Z825 Family history of asthma and other chronic lower respiratory diseases: Secondary | ICD-10-CM | POA: Diagnosis not present

## 2021-12-24 DIAGNOSIS — Z87891 Personal history of nicotine dependence: Secondary | ICD-10-CM

## 2021-12-24 DIAGNOSIS — I82403 Acute embolism and thrombosis of unspecified deep veins of lower extremity, bilateral: Secondary | ICD-10-CM | POA: Diagnosis not present

## 2021-12-24 DIAGNOSIS — J45909 Unspecified asthma, uncomplicated: Secondary | ICD-10-CM | POA: Diagnosis present

## 2021-12-24 DIAGNOSIS — K760 Fatty (change of) liver, not elsewhere classified: Secondary | ICD-10-CM | POA: Diagnosis not present

## 2021-12-24 DIAGNOSIS — N1831 Chronic kidney disease, stage 3a: Secondary | ICD-10-CM | POA: Diagnosis not present

## 2021-12-24 DIAGNOSIS — I82413 Acute embolism and thrombosis of femoral vein, bilateral: Secondary | ICD-10-CM | POA: Diagnosis not present

## 2021-12-24 DIAGNOSIS — I2602 Saddle embolus of pulmonary artery with acute cor pulmonale: Secondary | ICD-10-CM | POA: Diagnosis not present

## 2021-12-24 DIAGNOSIS — I700 Atherosclerosis of aorta: Secondary | ICD-10-CM | POA: Diagnosis not present

## 2021-12-24 DIAGNOSIS — I82461 Acute embolism and thrombosis of right calf muscular vein: Secondary | ICD-10-CM | POA: Diagnosis not present

## 2021-12-24 DIAGNOSIS — K219 Gastro-esophageal reflux disease without esophagitis: Secondary | ICD-10-CM | POA: Diagnosis present

## 2021-12-24 DIAGNOSIS — I2699 Other pulmonary embolism without acute cor pulmonale: Secondary | ICD-10-CM | POA: Diagnosis present

## 2021-12-24 DIAGNOSIS — I82443 Acute embolism and thrombosis of tibial vein, bilateral: Secondary | ICD-10-CM | POA: Diagnosis not present

## 2021-12-24 DIAGNOSIS — I6782 Cerebral ischemia: Secondary | ICD-10-CM | POA: Diagnosis not present

## 2021-12-24 DIAGNOSIS — I129 Hypertensive chronic kidney disease with stage 1 through stage 4 chronic kidney disease, or unspecified chronic kidney disease: Secondary | ICD-10-CM | POA: Diagnosis not present

## 2021-12-24 DIAGNOSIS — F419 Anxiety disorder, unspecified: Secondary | ICD-10-CM | POA: Diagnosis present

## 2021-12-24 DIAGNOSIS — R0602 Shortness of breath: Secondary | ICD-10-CM | POA: Diagnosis not present

## 2021-12-24 DIAGNOSIS — D689 Coagulation defect, unspecified: Secondary | ICD-10-CM | POA: Diagnosis not present

## 2021-12-24 DIAGNOSIS — G319 Degenerative disease of nervous system, unspecified: Secondary | ICD-10-CM | POA: Diagnosis not present

## 2021-12-24 DIAGNOSIS — Z801 Family history of malignant neoplasm of trachea, bronchus and lung: Secondary | ICD-10-CM | POA: Diagnosis not present

## 2021-12-24 DIAGNOSIS — R69 Illness, unspecified: Secondary | ICD-10-CM | POA: Diagnosis not present

## 2021-12-24 DIAGNOSIS — R791 Abnormal coagulation profile: Secondary | ICD-10-CM

## 2021-12-24 LAB — BASIC METABOLIC PANEL
Anion gap: 10 (ref 5–15)
BUN: 11 mg/dL (ref 8–23)
CO2: 26 mmol/L (ref 22–32)
Calcium: 9 mg/dL (ref 8.9–10.3)
Chloride: 103 mmol/L (ref 98–111)
Creatinine, Ser: 1.39 mg/dL — ABNORMAL HIGH (ref 0.61–1.24)
GFR, Estimated: 57 mL/min — ABNORMAL LOW (ref >60)
Glucose, Bld: 133 mg/dL — ABNORMAL HIGH (ref 70–99)
Potassium: 3.9 mmol/L (ref 3.5–5.1)
Sodium: 139 mmol/L (ref 135–145)

## 2021-12-24 LAB — MAGNESIUM: Magnesium: 2.1 mg/dL (ref 1.7–2.4)

## 2021-12-24 LAB — CBC WITH DIFFERENTIAL/PLATELET
Abs Immature Granulocytes: 0.25 10*3/uL — ABNORMAL HIGH (ref 0.00–0.07)
Basophils Absolute: 0.1 10*3/uL (ref 0.0–0.1)
Basophils Relative: 1 %
Eosinophils Absolute: 0.1 10*3/uL (ref 0.0–0.5)
Eosinophils Relative: 1 %
HCT: 46.6 % (ref 39.0–52.0)
Hemoglobin: 15.7 g/dL (ref 13.0–17.0)
Immature Granulocytes: 3 %
Lymphocytes Relative: 20 %
Lymphs Abs: 1.8 10*3/uL (ref 0.7–4.0)
MCH: 33.1 pg (ref 26.0–34.0)
MCHC: 33.7 g/dL (ref 30.0–36.0)
MCV: 98.3 fL (ref 80.0–100.0)
Monocytes Absolute: 0.9 10*3/uL (ref 0.1–1.0)
Monocytes Relative: 9 %
Neutro Abs: 6.1 10*3/uL (ref 1.7–7.7)
Neutrophils Relative %: 66 %
Platelets: 358 10*3/uL (ref 150–400)
RBC: 4.74 MIL/uL (ref 4.22–5.81)
RDW: 11.9 % (ref 11.5–15.5)
WBC: 9.2 10*3/uL (ref 4.0–10.5)
nRBC: 0 % (ref 0.0–0.2)

## 2021-12-24 LAB — HEPARIN LEVEL (UNFRACTIONATED): Heparin Unfractionated: 0.64 IU/mL (ref 0.30–0.70)

## 2021-12-24 LAB — PROTIME-INR
INR: 1 (ref 0.8–1.2)
Prothrombin Time: 13.4 seconds (ref 11.4–15.2)

## 2021-12-24 LAB — BRAIN NATRIURETIC PEPTIDE: B Natriuretic Peptide: 30.1 pg/mL (ref 0.0–100.0)

## 2021-12-24 MED ORDER — PANTOPRAZOLE SODIUM 40 MG PO TBEC
40.0000 mg | DELAYED_RELEASE_TABLET | Freq: Two times a day (BID) | ORAL | Status: DC
  Administered 2021-12-24 – 2021-12-26 (×4): 40 mg via ORAL
  Filled 2021-12-24 (×4): qty 1

## 2021-12-24 MED ORDER — METOPROLOL SUCCINATE ER 25 MG PO TB24
25.0000 mg | ORAL_TABLET | Freq: Every day | ORAL | Status: DC
  Administered 2021-12-25 – 2021-12-26 (×2): 25 mg via ORAL
  Filled 2021-12-24 (×2): qty 1

## 2021-12-24 MED ORDER — HEPARIN (PORCINE) 25000 UT/250ML-% IV SOLN
1900.0000 [IU]/h | INTRAVENOUS | Status: DC
  Administered 2021-12-24 – 2021-12-26 (×4): 1900 [IU]/h via INTRAVENOUS
  Filled 2021-12-24 (×4): qty 250

## 2021-12-24 MED ORDER — LISINOPRIL-HYDROCHLOROTHIAZIDE 20-12.5 MG PO TABS: 1.0000 | ORAL_TABLET | Freq: Every day | ORAL | Status: DC

## 2021-12-24 MED ORDER — DULOXETINE HCL 60 MG PO CPEP
60.0000 mg | ORAL_CAPSULE | Freq: Every day | ORAL | Status: DC
  Administered 2021-12-24 – 2021-12-25 (×2): 60 mg via ORAL
  Filled 2021-12-24 (×2): qty 1

## 2021-12-24 MED ORDER — BUSPIRONE HCL 5 MG PO TABS: 15.0000 mg | ORAL_TABLET | Freq: Three times a day (TID) | ORAL | Status: DC | PRN

## 2021-12-24 MED ORDER — SODIUM CHLORIDE 0.9 % IV SOLN: INTRAVENOUS | Status: DC

## 2021-12-24 MED ORDER — HEPARIN BOLUS VIA INFUSION
7500.0000 [IU] | Freq: Once | INTRAVENOUS | Status: AC
  Administered 2021-12-24: 7500 [IU] via INTRAVENOUS
  Filled 2021-12-24: qty 7500

## 2021-12-24 MED ORDER — ACETAMINOPHEN 650 MG RE SUPP: 650.0000 mg | Freq: Four times a day (QID) | RECTAL | Status: DC | PRN

## 2021-12-24 MED ORDER — HYDRALAZINE HCL 20 MG/ML IJ SOLN: 5.0000 mg | Freq: Four times a day (QID) | INTRAMUSCULAR | Status: DC | PRN

## 2021-12-24 MED ORDER — ACETAMINOPHEN 325 MG PO TABS: 650.0000 mg | ORAL_TABLET | Freq: Four times a day (QID) | ORAL | Status: DC | PRN

## 2021-12-24 MED ORDER — IOPAMIDOL (ISOVUE-370) INJECTION 76%
100.0000 mL | Freq: Once | INTRAVENOUS | Status: AC | PRN
  Administered 2021-12-24: 100 mL via INTRAVENOUS

## 2021-12-24 MED ORDER — SENNA 8.6 MG PO TABS
1.0000 | ORAL_TABLET | Freq: Two times a day (BID) | ORAL | Status: DC
Start: 2021-12-24 — End: 2021-12-26
  Administered 2021-12-24 – 2021-12-26 (×4): 8.6 mg via ORAL
  Filled 2021-12-24 (×4): qty 1

## 2021-12-24 MED ORDER — ALBUTEROL SULFATE (2.5 MG/3ML) 0.083% IN NEBU: 2.5000 mg | INHALATION_SOLUTION | RESPIRATORY_TRACT | Status: DC | PRN

## 2021-12-24 MED ORDER — MONTELUKAST SODIUM 10 MG PO TABS
10.0000 mg | ORAL_TABLET | Freq: Every day | ORAL | Status: DC
  Administered 2021-12-24 – 2021-12-25 (×2): 10 mg via ORAL
  Filled 2021-12-24 (×2): qty 1

## 2021-12-24 MED ORDER — MOMETASONE FURO-FORMOTEROL FUM 200-5 MCG/ACT IN AERO
2.0000 | INHALATION_SPRAY | Freq: Two times a day (BID) | RESPIRATORY_TRACT | Status: DC
  Administered 2021-12-25 – 2021-12-26 (×3): 2 via RESPIRATORY_TRACT
  Filled 2021-12-24: qty 8.8

## 2021-12-24 MED ORDER — HYDROCHLOROTHIAZIDE 12.5 MG PO TABS
12.5000 mg | ORAL_TABLET | Freq: Every day | ORAL | Status: DC
  Administered 2021-12-25 – 2021-12-26 (×2): 12.5 mg via ORAL
  Filled 2021-12-24 (×3): qty 1

## 2021-12-24 MED ORDER — LISINOPRIL 20 MG PO TABS
20.0000 mg | ORAL_TABLET | Freq: Every day | ORAL | Status: DC
  Administered 2021-12-25 – 2021-12-26 (×2): 20 mg via ORAL
  Filled 2021-12-24 (×2): qty 1

## 2021-12-24 NOTE — H&P (Addendum)
TRH H&P   Patient Demographics:    Justin English, is a 66 y.o. male  MRN: [id number]   DOB - 03/23/1956  Admit Date - 12/24/2021  Outpatient Primary MD for the patient is Everardo Beals, NP  Referring MD/NP/PA: PA Sofia  Patient coming from: home  Chief Complaint  Patient presents with   Shortness of Breath      HPI:    Justin English  is a 66 y.o. male, past medical history of CKD stage III A, GERD, asthma GERD, patient was sent by his PCP for PE, patient reports shortness of breath over last few days, has been progressive over couple weeks, went to see the nurse practitioner at his facility, there was a concern about PE, so he was sent for CT angio, which was done today, which was significant for bilateral nonocclusive saddle PE, so he was instructed to come to ED for further management, patient reports dyspnea, denies any fever, chills, cough or chest pain, no leg trauma, recent immobilization, or long distance travel, he denies any nausea, vomiting. -In the ED he had no hypoxia, he is showing sinus rhythm at 95 beats per minutes, creatinine noted to be elevated at 1.39, baseline appears to be around 1.5, patient was started on heparin drip, and [facility] hospitalist consulted to admit.    Review of systems:   A full 10 point Review of Systems was done, except as stated above, all other Review of Systems were negative.   With Past History of the following :    Past Medical History:  Diagnosis Date   Allergy    seasonal   Anxiety    Asthma    on recent inhaler use   Bronchitis    Diverticulitis    2019 per colonscopy   GERD (gastroesophageal reflux disease)    Hemorrhoids    none recent   Hiatal hernia    Hypertension    Sinus infection    hx of frequent sinus infection      Past Surgical History:  Procedure Laterality Date   colonsscopy  05/23/2018   and  endoscopy   INGUINAL HERNIA REPAIR Left    was 66 years old and 15 yrs ago   LEFT HEART CATH AND CORONARY ANGIOGRAPHY N/A 06/19/2018   Procedure: LEFT HEART CATH AND CORONARY ANGIOGRAPHY;  Surgeon: Lorretta Harp, MD;  Location: [facility];  Service: Cardiovascular;  Laterality: N/A;   UMBILICAL HERNIA REPAIR N/A 05/23/2019   Procedure: LAPAROSCOPIC UMBILICAL HERNIA REPAIR WITH MESH;  Surgeon: Ralene Ok, MD;  Location: [facility];  Service: General;  Laterality: N/A;      Social History:     Social History   Tobacco Use   Smoking status: Former    Types: Cigarettes    Quit date: 03/01/2015    Years since quitting: 6.8   Smokeless tobacco: Never  Substance Use Topics   Alcohol use: Not Currently       Family History :     Family History  Problem Relation Age of Onset   Bronchitis Mother    Prostate cancer Father    Lung cancer Father    Arthritis Brother    Colon cancer Neg Hx    Esophageal cancer Neg Hx    Inflammatory bowel disease Neg Hx    Liver disease Neg Hx    Pancreatic cancer Neg Hx    Rectal cancer Neg Hx    Stomach cancer Neg Hx       Home Medications:   Prior to Admission medications   Medication Sig Start Date End Date Taking? Authorizing Provider  albuterol (PROVENTIL HFA;VENTOLIN HFA) 108 (90 Base) MCG/ACT inhaler Inhale 1-2 puffs into the lungs every 6 (six) hours as needed for wheezing or shortness of breath. 08/23/18   Petrucelli, Samantha R, PA-C  cetirizine (ZYRTEC) 10 MG tablet Take 10 mg by mouth daily as needed for allergies.    [provider]  diphenhydrAMINE HCl (BENADRYL ALLERGY PO) Take 1 tablet by mouth daily as needed.    [provider]  DULoxetine (CYMBALTA) 60 MG capsule Take 60 mg by mouth at bedtime.     [provider]  fluticasone (FLONASE) 50 MCG/ACT nasal spray Place 1 spray into both nostrils daily.    [provider]  metoprolol succinate (TOPROL-XL) 25 MG 24  hr tablet Take 1 tablet (25 mg total) by mouth daily. Pt must make appt with provider for further refills - 1st attempt 08/10/19   Daune Perch, NP  montelukast (SINGULAIR) 10 MG tablet Take 10 mg by mouth at bedtime.    [provider]  pantoprazole (PROTONIX) 40 MG tablet TAKE ONE TABLET TWICE DAILY 10/05/21   Mansouraty, Telford Nab., MD  QVAR 80 MCG/ACT inhaler Inhale 2 puffs into the lungs daily as needed (wheezing or shortness of breath).  06/27/16   [provider]     Allergies:     Allergies  Allergen Reactions   Zithromax [Azithromycin] Hives and Itching    ZPAC/ caused hives     Physical Exam:   Vitals  Blood pressure (!) 164/103, pulse 84, temperature 99.2 F (37.3 C), temperature source Oral, resp. rate (!) 22, height '6\' 2"'$  (1.88 m), weight 111.1 kg, SpO2 95 %.   1. General well developed male, laying in bed in no apparent distress  2. Normal affect and insight, Not Suicidal or Homicidal, Awake Alert, Oriented X 3.  3. No F.N deficits, ALL C.Nerves Intact, Strength 5/5 all 4 extremities, Sensation intact all 4 extremities, Plantars down going.  4. Ears and Eyes appear Normal, Conjunctivae clear, PERRLA. Moist Oral Mucosa.  5. Supple Neck, No JVD, No cervical lymphadenopathy appriciated, No Carotid Bruits.  6. Symmetrical Chest wall movement, Good air movement bilaterally, CTAB.  7. RRR, No Gallops, Rubs or Murmurs, No Parasternal Heave.  Trace bilateral lower extremity edema  8. Positive Bowel Sounds, Abdomen Soft, No tenderness, No organomegaly appriciated,No rebound -guarding or rigidity.  9.  No Cyanosis, Normal Skin Turgor, No Skin Rash or Bruise.  10. Good muscle tone,  joints appear normal , no effusions, Normal ROM.  11. No Palpable Lymph Nodes in Neck or Axillae     Data Review:    CBC Recent Labs  Lab 12/24/21 1432  WBC 9.2  HGB 15.7  HCT 46.6  PLT 358  MCV 98.3  MCH 33.1  MCHC 33.7  RDW 11.9  LYMPHSABS 1.8  MONOABS  0.9  EOSABS 0.1  BASOSABS 0.1   ------------------------------------------------------------------------------------------------------------------  Chemistries  Recent Labs  Lab 12/24/21 1432  NA 139  K 3.9  CL 103  CO2 26  GLUCOSE 133*  BUN 11  CREATININE 1.39*  CALCIUM 9.0   ------------------------------------------------------------------------------------------------------------------ estimated creatinine clearance is 72.2 mL/min (A) (by C-G formula based on SCr of 1.39 mg/dL (H)). ------------------------------------------------------------------------------------------------------------------ No results for input(s): "TSH", "T4TOTAL", "T3FREE", "THYROIDAB" in the last 72 hours.  Invalid input(s): "FREET3"  Coagulation profile Recent Labs  Lab 12/24/21 1432  INR 1.0   ------------------------------------------------------------------------------------------------------------------- No results for input(s): "DDIMER" in the last 72 hours. -------------------------------------------------------------------------------------------------------------------  Cardiac Enzymes No results for input(s): "CKMB", "TROPONINI", "MYOGLOBIN" in the last 168 hours.  Invalid input(s): "CK" ------------------------------------------------------------------------------------------------------------------    Component Value Date/Time   BNP 30.1 12/24/2021 1432     ---------------------------------------------------------------------------------------------------------------  Urinalysis    Component Value Date/Time   COLORURINE COLORLESS (A) 08/05/2016 1236   APPEARANCEUR CLEAR 08/05/2016 1236   LABSPEC 1.001 (L) 08/05/2016 1236   PHURINE 8.0 08/05/2016 1236   GLUCOSEU NEGATIVE 08/05/2016 1236   HGBUR NEGATIVE 08/05/2016 1236   BILIRUBINUR NEGATIVE 08/05/2016 1236   KETONESUR NEGATIVE 08/05/2016 1236   PROTEINUR NEGATIVE 08/05/2016 1236   NITRITE NEGATIVE 08/05/2016 1236    LEUKOCYTESUR NEGATIVE 08/05/2016 1236    ----------------------------------------------------------------------------------------------------------------   Imaging Results:    DG Chest 2 View  Result Date: 12/24/2021 CLINICAL DATA:  Shortness of breath, recently diagnosed PE EXAM: CHEST - 2 VIEW COMPARISON:  08/23/2018 FINDINGS: Cardiac size is within normal limits. Lung fields are clear of any infiltrate or pulmonary edema. There is no pleural effusion or pneumothorax. Small transverse linear densities in the lateral aspect of left lower lung field Justin suggest scarring or subsegmental atelectasis. IMPRESSION: There are no signs of pulmonary edema or focal pulmonary consolidation. Small transverse linear densities near the left lateral CP angle suggest minimal scarring or subsegmental atelectasis. Electronically Signed   By: Elmer Picker M.D.   On: 12/24/2021 14:45   CT Angio Chest Pulmonary Embolism (PE) W or WO Contrast  Result Date: 12/24/2021 CLINICAL DATA:  Elevated D-dimer.  Abnormal coagulation profile. EXAM: CT ANGIOGRAPHY CHEST WITH CONTRAST TECHNIQUE: Multidetector CT imaging of the chest was performed using the standard protocol during bolus administration of intravenous contrast. Multiplanar CT image reconstructions and MIPs were obtained to evaluate the vascular anatomy. RADIATION DOSE REDUCTION: This exam was performed according to the departmental dose-optimization program which includes automated exposure control, adjustment of the mA and/or kV according to patient size and/or use of iterative reconstruction technique. CONTRAST:  151m ISOVUE-370 IOPAMIDOL (ISOVUE-370) INJECTION 76% COMPARISON:  CT chest 06/17/2018 FINDINGS: Cardiovascular: Heart size is normal. Aorta and great vessel origins within normal limits. Pulmonary artery opacification is excellent. Multiple filling defects are present. A nonocclusive saddle embolus crosses the main pulmonary artery bifurcation. Clot  burden is greater in the right lung than left. Emboli extend into the upper lobe, middle lobe and lower lobe on the right and both upper and lower lobe on the left. The RV:LV ratio is less than 1 Mediastinum/Nodes: No enlarged mediastinal, hilar, or axillary lymph nodes. Thyroid gland, trachea, and esophagus demonstrate no significant findings. Lungs/Pleura: Focal thickening noted along the major fissure inferiorly on the left. Lungs are otherwise clear. No significant pleural effusion is present. Upper Abdomen: Diffuse fatty infiltration of the liver is now present. The visualized scratched at no discrete lesions are present. The visualized  upper abdomen is otherwise within normal limits. Musculoskeletal: No chest wall abnormality. No acute or significant osseous findings. Review of the MIP images confirms the above findings. IMPRESSION: 1. Multiple bilateral pulmonary emboli with a nonocclusive saddle embolus. 2. No evidence for right heart strain. 3. New fatty infiltration of the liver. These results were called by telephone at the time of interpretation on 12/24/2021 at 1:50 pm to provider Therese Sarah, NP, who verbally acknowledged these results. Electronically Signed   By: San Morelle M.D.   On: 12/24/2021 14:02    My personal review of EKG: Rhythm NSR, Rate  95/min, QTc 500 ,     Assessment & Plan:    Principal Problem:   Pulmonary embolism (HCC) Active Problems:   Gastroesophageal reflux disease   Stage 3a chronic kidney disease (CKD) (HCC)  Pulmonary Embolism  -Patient presents with dyspnea, his work-up significant for nonocclusive saddle bilateral PE.  -No provoking factors could be identified, no family history of PE, no recent travel, no trauma or recent surgery -will be admitted to progressive care given saddle PE. -will obtain 2D echo. -will obtain venous Dopplers. -will keep on heparin GTT for 48 hours, then transition to oral agent. -He appears to be up-to-date on his  screening when it comes to endoscopy/colonoscopy. -Quit smoking 7 years ago, no evidence of lung malignancy on imaging  CKD stage III A -Renal function appears to be at baseline, continue with IV fluid has received IV contrast.\\  GERD -Continue with PPI  History of asthma -No active wheezing, continue with Singulair and as needed albuterol, continue with Symbicort>> Dulera as formulary  Hypertension -Continue with home medication including Toprol-XL, lisinopril/hydrochlorothiazide  Anxiety -Continue with as needed BuSpar and Cymbalta  Prolonged QTc -Avoid prolonging agents, check magnesium level, monitoring telemetry  DVT Prophylaxis Heparin GTT  AM Labs Ordered, also please review Full Orders  Family Communication: Admission, patients condition and plan of care including tests being ordered have been discussed with the patient  who indicate understanding and agree with the plan and Code Status.  Code Status full code  Likely DC to home  Condition GUARDED  Consults called: None  Admission status: Inpatient  Time spent in minutes : 70 minutes   Phillips Climes M.D on 12/24/2021 at [place] PM   [facility] - Office  [phone]

## 2021-12-24 NOTE — Progress Notes (Signed)
[facility] for Heparin Indication: pulmonary embolus  Allergies  Allergen Reactions   Zithromax [Azithromycin] Hives and Itching    ZPAC/ caused hives    Patient Measurements: Height: '6\' 2"'$  (188 cm) Weight: 111.1 kg (245 lb) IBW/kg (Calculated) : 82.2 Heparin Dosing Weight: 105.3  Vital Signs: Temp: 98 F (36.7 C) (07/27 1902) Temp Source: Oral (07/27 1902) BP: 149/103 (07/27 1915) Pulse Rate: 106 (07/27 1915)  Labs: Recent Labs    12/24/21 1432 12/24/21 2252  HGB 15.7  --   HCT 46.6  --   PLT 358  --   LABPROT 13.4  --   INR 1.0  --   HEPARINUNFRC  --  0.64  CREATININE 1.39*  --      Estimated Creatinine Clearance: 72.2 mL/min (A) (by C-G formula based on SCr of 1.39 mg/dL (H)).   Medical History: Past Medical History:  Diagnosis Date   Allergy    seasonal   Anxiety    Asthma    on recent inhaler use   Bronchitis    Diverticulitis    2019 per colonscopy   GERD (gastroesophageal reflux disease)    Hemorrhoids    none recent   Hiatal hernia    Hypertension    Sinus infection    hx of frequent sinus infection    Medications:  Medications Prior to Admission  Medication Sig Dispense Refill Last Dose   albuterol (PROVENTIL HFA;VENTOLIN HFA) 108 (90 Base) MCG/ACT inhaler Inhale 1-2 puffs into the lungs every 6 (six) hours as needed for wheezing or shortness of breath. 1 Inhaler 0 12/24/2021   budesonide-formoterol (SYMBICORT) 160-4.5 MCG/ACT inhaler Inhale 2 puffs into the lungs 2 (two) times daily.   12/24/2021   busPIRone (BUSPAR) 15 MG tablet Take 15 mg by mouth 3 (three) times daily as needed.   12/23/2021   DULoxetine (CYMBALTA) 60 MG capsule Take 60 mg by mouth at bedtime.    12/23/2021   lisinopril-hydrochlorothiazide (ZESTORETIC) 20-12.5 MG tablet Take 1 tablet by mouth daily.   12/24/2021   metoprolol succinate (TOPROL-XL) 25 MG 24 hr tablet Take 1 tablet (25 mg total) by mouth daily. Pt must make appt with  provider for further refills - 1st attempt 30 tablet 0 12/24/2021 at 7:00am   pantoprazole (PROTONIX) 40 MG tablet TAKE ONE TABLET TWICE DAILY 60 tablet 4 12/23/2021   QVAR 80 MCG/ACT inhaler Inhale 2 puffs into the lungs daily as needed (wheezing or shortness of breath).    unknown    Scheduled:  Infusions:  PRN:   Assessment: 4 yom with a history of HTN, anxiety, acid reflux. Patient is presenting with SOB. Pt had CT done outpt w/ multiple PEs. Heparin per pharmacy consult placed for pulmonary embolus.  CTA PE w/ Multiple bilateral pulmonary emboli with a nonocclusive saddle embolus. No CT evidence of RHS.  Patient is not on anticoagulation prior to arrival.  Hgb 15.7; plt 358 PT / INR 13.4 / 1  7/27 PM update:  Heparin level therapeutic   Goal of Therapy:  Heparin level 0.3-0.7 units/ml Monitor platelets by anticoagulation protocol: Yes   Plan:  Cont heparin 1900 units/hr Heparin level with AM labs  Narda Bonds, PharmD, [place] Pharmacist Phone: [phone]

## 2021-12-24 NOTE — ED Provider Notes (Signed)
[facility] EMERGENCY DEPARTMENT Provider Note   CSN: [id number] Arrival date & time: 12/24/21  1405     History  Chief Complaint  Patient presents with   Shortness of Breath    Justin English is a 66 y.o. male.  He has been feeling short of breath for several days he was seen by his primary care provider Yisroel Ramming nurse practitioner.  She ordered a outpatient CT angio of his chest to evaluate him for pulmonary embolus.  Patient was sent from radiology to the emergency department because his CT scan does show a pulmonary embolus.  Patient denies any complaints other than being shortness of breath.  Patient denies fever chills he has not experienced any cough he denies any chest pain.  Patient denies any abdominal pain.  The history is provided by the patient. No language interpreter was used.  Shortness of Breath Severity:  Moderate Onset quality:  Gradual Duration:  2 weeks Timing:  Constant Progression:  Worsening Chronicity:  New Relieved by:  Nothing Ineffective treatments:  None tried Associated symptoms: no cough        Home Medications Prior to Admission medications   Medication Sig Start Date End Date Taking? Authorizing Provider  albuterol (PROVENTIL HFA;VENTOLIN HFA) 108 (90 Base) MCG/ACT inhaler Inhale 1-2 puffs into the lungs every 6 (six) hours as needed for wheezing or shortness of breath. 08/23/18   Petrucelli, Samantha R, PA-C  cetirizine (ZYRTEC) 10 MG tablet Take 10 mg by mouth daily as needed for allergies.    [provider]  diphenhydrAMINE HCl (BENADRYL ALLERGY PO) Take 1 tablet by mouth daily as needed.    [provider]  DULoxetine (CYMBALTA) 60 MG capsule Take 60 mg by mouth at bedtime.     [provider]  fluticasone (FLONASE) 50 MCG/ACT nasal spray Place 1 spray into both nostrils daily.    [provider]  metoprolol succinate (TOPROL-XL) 25 MG 24 hr tablet Take 1 tablet (25 mg total) by  mouth daily. Pt must make appt with provider for further refills - 1st attempt 08/10/19   Daune Perch, NP  montelukast (SINGULAIR) 10 MG tablet Take 10 mg by mouth at bedtime.    [provider]  pantoprazole (PROTONIX) 40 MG tablet TAKE ONE TABLET TWICE DAILY 10/05/21   Mansouraty, Telford Nab., MD  QVAR 80 MCG/ACT inhaler Inhale 2 puffs into the lungs daily as needed (wheezing or shortness of breath).  06/27/16   [provider]      Allergies    Zithromax [azithromycin]    Review of Systems   Review of Systems  Respiratory:  Positive for shortness of breath. Negative for cough.   All other systems reviewed and are negative.   Physical Exam Updated Vital Signs BP (!) 157/110   Pulse 91   Temp 99.2 F (37.3 C) (Oral)   Resp 17   Ht '6\' 2"'$  (1.88 m)   Wt 111.1 kg   SpO2 97%   BMI 31.46 kg/m  Physical Exam Vitals and nursing note reviewed.  Constitutional:      Appearance: He is well-developed.  HENT:     Head: Normocephalic.  Cardiovascular:     Rate and Rhythm: Normal rate.  Pulmonary:     Effort: Pulmonary effort is normal.     Breath sounds: No decreased breath sounds.  Chest:     Chest wall: No mass.  Abdominal:     General: There is no distension.  Musculoskeletal:  General: Normal range of motion.     Cervical back: Normal range of motion.  Skin:    General: Skin is warm.  Neurological:     General: No focal deficit present.     Mental Status: He is alert and oriented to person, place, and time.  Psychiatric:        Mood and Affect: Mood normal.     ED Results / Procedures / Treatments   Labs (all labs ordered are listed, but only abnormal results are displayed) Labs Reviewed  BASIC METABOLIC PANEL - Abnormal; Notable for the following components:      Result Value   Glucose, Bld 133 (*)    Creatinine, Ser 1.39 (*)    GFR, Estimated 57 (*)    All other components within normal limits  CBC WITH DIFFERENTIAL/PLATELET -  Abnormal; Notable for the following components:   Abs Immature Granulocytes 0.25 (*)    All other components within normal limits  BRAIN NATRIURETIC PEPTIDE  PROTIME-INR    EKG None  Radiology DG Chest 2 View  Result Date: 12/24/2021 CLINICAL DATA:  Shortness of breath, recently diagnosed PE EXAM: CHEST - 2 VIEW COMPARISON:  08/23/2018 FINDINGS: Cardiac size is within normal limits. Lung fields are clear of any infiltrate or pulmonary edema. There is no pleural effusion or pneumothorax. Small transverse linear densities in the lateral aspect of left lower lung field may suggest scarring or subsegmental atelectasis. IMPRESSION: There are no signs of pulmonary edema or focal pulmonary consolidation. Small transverse linear densities near the left lateral CP angle suggest minimal scarring or subsegmental atelectasis. Electronically Signed   By: Elmer Picker M.D.   On: 12/24/2021 14:45   CT Angio Chest Pulmonary Embolism (PE) W or WO Contrast  Result Date: 12/24/2021 CLINICAL DATA:  Elevated D-dimer.  Abnormal coagulation profile. EXAM: CT ANGIOGRAPHY CHEST WITH CONTRAST TECHNIQUE: Multidetector CT imaging of the chest was performed using the standard protocol during bolus administration of intravenous contrast. Multiplanar CT image reconstructions and MIPs were obtained to evaluate the vascular anatomy. RADIATION DOSE REDUCTION: This exam was performed according to the departmental dose-optimization program which includes automated exposure control, adjustment of the mA and/or kV according to patient size and/or use of iterative reconstruction technique. CONTRAST:  158m ISOVUE-370 IOPAMIDOL (ISOVUE-370) INJECTION 76% COMPARISON:  CT chest 06/17/2018 FINDINGS: Cardiovascular: Heart size is normal. Aorta and great vessel origins within normal limits. Pulmonary artery opacification is excellent. Multiple filling defects are present. A nonocclusive saddle embolus crosses the main pulmonary artery  bifurcation. Clot burden is greater in the right lung than left. Emboli extend into the upper lobe, middle lobe and lower lobe on the right and both upper and lower lobe on the left. The RV:LV ratio is less than 1 Mediastinum/Nodes: No enlarged mediastinal, hilar, or axillary lymph nodes. Thyroid gland, trachea, and esophagus demonstrate no significant findings. Lungs/Pleura: Focal thickening noted along the major fissure inferiorly on the left. Lungs are otherwise clear. No significant pleural effusion is present. Upper Abdomen: Diffuse fatty infiltration of the liver is now present. The visualized scratched at no discrete lesions are present. The visualized upper abdomen is otherwise within normal limits. Musculoskeletal: No chest wall abnormality. No acute or significant osseous findings. Review of the MIP images confirms the above findings. IMPRESSION: 1. Multiple bilateral pulmonary emboli with a nonocclusive saddle embolus. 2. No evidence for right heart strain. 3. New fatty infiltration of the liver. These results were called by telephone at the time of interpretation on  12/24/2021 at 1:50 pm to provider Therese Sarah, NP, who verbally acknowledged these results. Electronically Signed   By: San Morelle M.D.   On: 12/24/2021 14:02    Procedures Procedures    Medications Ordered in ED Medications - No data to display  ED Course/ Medical Decision Making/ A&P                           Medical Decision Making Patient complains of shortness of breath for the last 2 weeks.  He denies any fever or chills  Amount and/or Complexity of Data Reviewed External Data Reviewed: notes.    Details: Patient had an outpatient CT scan which showed pulmonary embolus Labs: ordered. Decision-making details documented in ED Course.    Details: Labs ordered reviewed and interpreted Radiology: ordered and independent interpretation performed. Decision-making details documented in ED Course.    Details:  Outpatient CT scan reviewed positive for pulmonary embolus ECG/medicine tests: ordered and independent interpretation performed. Decision-making details documented in ED Course. Discussion of management or test interpretation with external provider(s): Unassigned medicine consulted for admission.  Pharmacy consulted for heparin.  Risk Decision regarding hospitalization.           Final Clinical Impression(s) / ED Diagnoses Final diagnoses:  Other acute pulmonary embolism without acute cor pulmonale [facility])    Rx / DC Orders ED Discharge Orders     None         Sidney Ace 12/24/21 Sherren Kerns, MD 12/24/21 2348

## 2021-12-24 NOTE — ED Triage Notes (Signed)
Pt arrived POV from home c/o [facility] for a couple weeks. Had a CT done by his PCP today that showed multiple PE's. Sent for further management.

## 2021-12-24 NOTE — ED Provider Triage Note (Signed)
Emergency Medicine Provider Triage Evaluation Note  Justin English , a 66 y.o. male  was evaluated in triage.  Pt complains of shortness of breath for the last couple of weeks.  Patient evaluated by PCP last week, started on anticoagulation medication per patient and sent to have a CT of his chest.  He had the CT this morning, sent here for further management after multiple pulmonary embolisms were found.  Persist fatigue has been ongoing for a couple of weeks.  No tobacco use, no prior history of CAD.No anticoags meds noted on his chart.  Review of Systems  Positive: Chest pain, sob Negative: Leg swelling, cough  Physical Exam  There were no vitals taken for this visit. Gen:   Awake, no distress   Resp:  Normal effort  MSK:   Moves extremities without difficulty  Other:    Medical Decision Making  Medically screening exam initiated at 2:14 PM.  Appropriate orders placed.  Justin English was informed that the remainder of the evaluation will be completed by another provider, this initial triage assessment does not replace that evaluation, and the importance of remaining in the ED until their evaluation is complete.  IMPRESSION: 1. Multiple bilateral pulmonary emboli with a nonocclusive saddle embolus. 2. No evidence for right heart strain. 3. New fatty infiltration of the liver.     Janeece Fitting, PA-C 12/24/21 1421

## 2021-12-24 NOTE — Progress Notes (Signed)
ANTICOAGULATION CONSULT NOTE - Initial Consult  Pharmacy Consult for Heparin Indication: pulmonary embolus  Allergies  Allergen Reactions   Zithromax [Azithromycin] Hives and Itching    ZPAC/ caused hives    Patient Measurements: Height: '6\' 2"'$  (188 cm) Weight: 111.1 kg (245 lb) IBW/kg (Calculated) : 82.2 Heparin Dosing Weight: 105.3  Vital Signs: Temp: 99.2 F (37.3 C) (07/27 1425) Temp Source: Oral (07/27 1425) BP: 157/110 (07/27 1535) Pulse Rate: 91 (07/27 1535)  Labs: Recent Labs    12/24/21 1432  HGB 15.7  HCT 46.6  PLT 358  LABPROT 13.4  INR 1.0  CREATININE 1.39*    Estimated Creatinine Clearance: 72.2 mL/min (A) (by C-G formula based on SCr of 1.39 mg/dL (H)).   Medical History: Past Medical History:  Diagnosis Date   Allergy    seasonal   Anxiety    Asthma    on recent inhaler use   Bronchitis    Diverticulitis    2019 per colonscopy   GERD (gastroesophageal reflux disease)    Hemorrhoids    none recent   Hiatal hernia    Hypertension    Sinus infection    hx of frequent sinus infection    Medications:  (Not in a hospital admission)  Scheduled:  Infusions:  PRN:   Assessment: 25 yom with a history of HTN, anxiety, acid reflux. Patient is presenting with SOB. Pt had CT done outpt w/ multiple PEs. Heparin per pharmacy consult placed for pulmonary embolus.  CTA PE w/ Multiple bilateral pulmonary emboli with a nonocclusive saddle embolus. No CT evidence of RHS.  Patient is not on anticoagulation prior to arrival.  Hgb 15.7; plt 358 PT / INR 13.4 / 1  Goal of Therapy:  Heparin level 0.3-0.7 units/ml Monitor platelets by anticoagulation protocol: Yes   Plan:  Give 7500 units bolus x 1 Start heparin infusion at 1900 units/hr Check anti-Xa level at 2300 and daily while on heparin Continue to monitor H&H and platelets  Lorelei Pont, PharmD, BCPS 12/24/2021 4:11 PM ED Clinical Pharmacist -  [phone]

## 2021-12-25 ENCOUNTER — Inpatient Hospital Stay (HOSPITAL_COMMUNITY): Payer: 59

## 2021-12-25 ENCOUNTER — Telehealth (HOSPITAL_COMMUNITY): Payer: Self-pay | Admitting: Pharmacy Technician

## 2021-12-25 ENCOUNTER — Other Ambulatory Visit (HOSPITAL_COMMUNITY): Payer: Self-pay

## 2021-12-25 DIAGNOSIS — N1831 Chronic kidney disease, stage 3a: Secondary | ICD-10-CM | POA: Diagnosis not present

## 2021-12-25 DIAGNOSIS — I2699 Other pulmonary embolism without acute cor pulmonale: Secondary | ICD-10-CM

## 2021-12-25 DIAGNOSIS — K219 Gastro-esophageal reflux disease without esophagitis: Secondary | ICD-10-CM | POA: Diagnosis not present

## 2021-12-25 DIAGNOSIS — I2692 Saddle embolus of pulmonary artery without acute cor pulmonale: Secondary | ICD-10-CM | POA: Diagnosis not present

## 2021-12-25 DIAGNOSIS — I2602 Saddle embolus of pulmonary artery with acute cor pulmonale: Secondary | ICD-10-CM

## 2021-12-25 LAB — CBC
HCT: 40.9 % (ref 39.0–52.0)
Hemoglobin: 13.9 g/dL (ref 13.0–17.0)
MCH: 33.3 pg (ref 26.0–34.0)
MCHC: 34 g/dL (ref 30.0–36.0)
MCV: 98.1 fL (ref 80.0–100.0)
Platelets: 313 10*3/uL (ref 150–400)
RBC: 4.17 MIL/uL — ABNORMAL LOW (ref 4.22–5.81)
RDW: 11.9 % (ref 11.5–15.5)
WBC: 9.1 10*3/uL (ref 4.0–10.5)
nRBC: 0 % (ref 0.0–0.2)

## 2021-12-25 LAB — ECHOCARDIOGRAM COMPLETE
AR max vel: 2.64 cm2
AV Area VTI: 2.71 cm2
AV Area mean vel: 2.57 cm2
AV Mean grad: 3 mmHg
AV Peak grad: 5.8 mmHg
Ao pk vel: 1.2 m/s
Area-P 1/2: 4.83 cm2
Height: 74 in
S' Lateral: 3.4 cm
Weight: 3918.4 oz

## 2021-12-25 LAB — COMPREHENSIVE METABOLIC PANEL
ALT: 38 U/L (ref 0–44)
AST: 26 U/L (ref 15–41)
Albumin: 3.2 g/dL — ABNORMAL LOW (ref 3.5–5.0)
Alkaline Phosphatase: 75 U/L (ref 38–126)
Anion gap: 9 (ref 5–15)
BUN: 15 mg/dL (ref 8–23)
CO2: 24 mmol/L (ref 22–32)
Calcium: 8.8 mg/dL — ABNORMAL LOW (ref 8.9–10.3)
Chloride: 103 mmol/L (ref 98–111)
Creatinine, Ser: 1.36 mg/dL — ABNORMAL HIGH (ref 0.61–1.24)
GFR, Estimated: 58 mL/min — ABNORMAL LOW (ref >60)
Glucose, Bld: 120 mg/dL — ABNORMAL HIGH (ref 70–99)
Potassium: 3.8 mmol/L (ref 3.5–5.1)
Sodium: 136 mmol/L (ref 135–145)
Total Bilirubin: 1 mg/dL (ref 0.3–1.2)
Total Protein: 6.3 g/dL — ABNORMAL LOW (ref 6.5–8.1)

## 2021-12-25 LAB — HEPARIN LEVEL (UNFRACTIONATED): Heparin Unfractionated: 0.57 IU/mL (ref 0.30–0.70)

## 2021-12-25 LAB — HIV ANTIBODY (ROUTINE TESTING W REFLEX): HIV Screen 4th Generation wRfx: NONREACTIVE

## 2021-12-25 MED ORDER — APIXABAN (ELIQUIS) VTE STARTER PACK (10MG AND 5MG)
ORAL_TABLET | ORAL | 0 refills | Status: DC
  Filled 2021-12-25: qty 74, 30d supply, fill #0

## 2021-12-25 NOTE — TOC Benefit Eligibility Note (Signed)
Patient Teacher, English as a foreign language completed.    The patient is currently admitted and upon discharge could be taking Eliquis 5 mg.  The current 30 day co-pay is $15.00.   The patient is insured through [place], [place] Patient [place] Patient Advocate Team Direct Number: [phone]  Fax: [phone]

## 2021-12-25 NOTE — Discharge Instructions (Addendum)

## 2021-12-25 NOTE — Progress Notes (Signed)
[place] for Heparin Indication: pulmonary embolus  Allergies  Allergen Reactions   Zithromax [Azithromycin] Hives and Itching    ZPAC/ caused hives    Patient Measurements: Height: '6\' 2"'$  (188 cm) Weight: 111.1 kg (244 lb 14.4 oz) IBW/kg (Calculated) : 82.2 Heparin Dosing Weight: 105.3  Vital Signs: Temp: 98 F (36.7 C) (07/28 0748) Temp Source: Oral (07/28 0748) BP: 163/108 (07/28 0748) Pulse Rate: 84 (07/28 0748)  Labs: Recent Labs    12/24/21 1432 12/24/21 2252 12/25/21 0445  HGB 15.7  --  13.9  HCT 46.6  --  40.9  PLT 358  --  313  LABPROT 13.4  --   --   INR 1.0  --   --   HEPARINUNFRC  --  0.64 0.57  CREATININE 1.39*  --  1.36*     Estimated Creatinine Clearance: 73.8 mL/min (A) (by C-G formula based on SCr of 1.36 mg/dL (H)).  Assessment: 60 yom with a history of HTN, anxiety, acid reflux. Patient is presenting with SOB. Pt had CT done outpt w/ multiple PEs. Heparin per pharmacy consult placed for pulmonary embolus.  CTA PE w/ Multiple bilateral pulmonary emboli with a nonocclusive saddle embolus. No CT evidence of RHS.  Heparin level therapeutic this AM, CBC stable  Goal of Therapy:  Heparin level 0.3-0.7 units/ml Monitor platelets by anticoagulation protocol: Yes   Plan:  Cont heparin 1900 units/hr Heparin level with AM labs  Thank you Anette Guarneri, PharmD

## 2021-12-25 NOTE — Telephone Encounter (Signed)
Pharmacy Patient Advocate Encounter  Insurance verification completed.    The patient is insured through [employer]   The patient is currently admitted and ran test claims for the following: Eliquis.  Copays and coinsurance results were relayed to Inpatient clinical team.

## 2021-12-25 NOTE — Progress Notes (Signed)
PROGRESS NOTE    Justin English  ZOX:[id number] DOB: 1955-06-30 DOA: 12/24/2021 PCP: Everardo Beals, NP   Chief Complaint  Patient presents with   Shortness of Breath    Brief Narrative:    Justin English  is a 66 y.o. male, past medical history of CKD stage III A, GERD, asthma GERD, patient was sent by his PCP for PE, patient reports shortness of breath over last few days, has been progressive over couple weeks, went to see the nurse practitioner at his facility, there was a concern about PE, so he was sent for CT angio, which was done today, which was significant for bilateral nonocclusive saddle PE, so he was instructed to come to ED for further management, patient reports dyspnea, denies any fever, chills, cough or chest pain, no leg trauma, recent immobilization, or long distance travel, he denies any nausea, vomiting. -In the ED he had no hypoxia, he is showing sinus rhythm at 95 beats per minutes, creatinine noted to be elevated at 1.39, baseline appears to be around 1.5, patient was started on heparin drip.   Assessment & Plan:   Principal Problem:   Pulmonary embolism (HCC) Active Problems:   Gastroesophageal reflux disease   Stage 3a chronic kidney disease (CKD) ([place])  Pulmonary Embolism  -Patient presents with dyspnea, his work-up significant for nonocclusive saddle bilateral PE.  -.No clear provoking factors could be identified -2D echo -Follow on lower extremity vascular Dopplers to rule out DVT -Continue with heparin GTT for next 24 hours then transition to Eliquis tomorrow -No evidence of malignancy on imaging obtained by CT abdomen pelvis, -He appears to be up-to-date on his screening when it comes to endoscopy/colonoscopy. -Quit smoking 7 years ago, no evidence of lung malignancy on imaging   CKD stage III A -Renal function appears to be at baseline, continue with IV fluid has received IV contrast.   GERD -Continue with PPI   History of asthma -No active  wheezing, continue with Singulair and as needed albuterol, continue with Symbicort>> Dulera as formulary   Hypertension -Continue with home medication including Toprol-XL, lisinopril/hydrochlorothiazide   Anxiety -Continue with as needed BuSpar and Cymbalta   Prolonged QTc -Avoid prolonging agents, check magnesium level, monitoring telemetry       DVT prophylaxis: Heparin GTT Code Status: Full code Family Communication: None at bedside Disposition:   Status is: Inpatient Remains inpatient appropriate because: Heparin GTT   Consultants:  none   Subjective:  He denies any complaints today, no chest pain, no shortness of breath, no fever, no chills, no nausea or vomiting  Objective: Vitals:   12/25/21 0611 12/25/21 0748 12/25/21 0830 12/25/21 1233  BP: (!) 145/87 (!) 163/108  (!) 172/98  Pulse: 82 84    Resp: 18 14    Temp: 98.2 F (36.8 C) 98 F (36.7 C)  98 F (36.7 C)  TempSrc: Oral Oral  Oral  SpO2: 92% 93% 94%   Weight:      Height:        Intake/Output Summary (Last 24 hours) at 12/25/2021 1305 Last data filed at 12/25/2021 0456 Gross per 24 hour  Intake 770.76 ml  Output 400 ml  Net 370.76 ml   Filed Weights   12/24/21 1425 12/24/21 2048  Weight: 111.1 kg 111.1 kg    Examination:  General exam: Appears calm and comfortable  Respiratory system: Clear to auscultation. Respiratory effort normal. Cardiovascular system: S1 & S2 heard, RRR. No JVD, murmurs, rubs, gallops or clicks. No  pedal edema. Gastrointestinal system: Abdomen is nondistended, soft and nontender. No organomegaly or masses felt. Normal bowel sounds heard. Central nervous system: Alert and oriented. No focal neurological deficits. Extremities: Symmetric 5 x 5 power. Skin: No rashes, lesions or ulcers Psychiatry: Judgement and insight appear normal. Mood & affect appropriate.     Data Reviewed: I have personally reviewed following labs and imaging studies  CBC: Recent Labs  Lab  12/24/21 1432 12/25/21 0445  WBC 9.2 9.1  NEUTROABS 6.1  --   HGB 15.7 13.9  HCT 46.6 40.9  MCV 98.3 98.1  PLT 358 166    Basic Metabolic Panel: Recent Labs  Lab 12/24/21 1432 12/24/21 1452 12/25/21 0445  NA 139  --  136  K 3.9  --  3.8  CL 103  --  103  CO2 26  --  24  GLUCOSE 133*  --  120*  BUN 11  --  15  CREATININE 1.39*  --  1.36*  CALCIUM 9.0  --  8.8*  MG  --  2.1  --     GFR: Estimated Creatinine Clearance: 73.8 mL/min (A) (by C-G formula based on SCr of 1.36 mg/dL (H)).  Liver Function Tests: Recent Labs  Lab 12/25/21 0445  AST 26  ALT 38  ALKPHOS 75  BILITOT 1.0  PROT 6.3*  ALBUMIN 3.2*    CBG: No results for input(s): "GLUCAP" in the last 168 hours.   No results found for this or any previous visit (from the past 240 hour(s)).       Radiology Studies: CT ABDOMEN PELVIS WO CONTRAST  Result Date: 12/24/2021 CLINICAL DATA:  Pain.  Recent diagnosis of pulmonary embolus today. EXAM: CT ABDOMEN AND PELVIS WITHOUT CONTRAST TECHNIQUE: Multidetector CT imaging of the abdomen and pelvis was performed following the standard protocol without IV contrast. RADIATION DOSE REDUCTION: This exam was performed according to the departmental dose-optimization program which includes automated exposure control, adjustment of the mA and/or kV according to patient size and/or use of iterative reconstruction technique. COMPARISON:  CT chest 12/24/2021 FINDINGS: Lower chest: Moderate-sized esophageal hiatal hernia. Lung bases are clear. Hepatobiliary: No focal liver abnormality is seen. No gallstones, gallbladder wall thickening, or biliary dilatation. Diffuse fatty infiltration of the liver. Pancreas: Unremarkable. No pancreatic ductal dilatation or surrounding inflammatory changes. Spleen: Normal in size without focal abnormality. Adrenals/Urinary Tract: Adrenal glands are unremarkable. Kidneys are normal, without renal calculi, focal lesion, or hydronephrosis. Bladder is  unremarkable. Residual contrast material in the urinary tract. Stomach/Bowel: Stomach is within normal limits. Appendix appears normal. No evidence of bowel wall thickening, distention, or inflammatory changes. Vascular/Lymphatic: Aortic atherosclerosis. No enlarged abdominal or pelvic lymph nodes. Reproductive: Prostate is unremarkable. Other: No abdominal wall hernia or abnormality. No abdominopelvic ascites. Musculoskeletal: No acute or significant osseous findings. IMPRESSION: 1. No acute process demonstrated in the abdomen or pelvis. No solid organ or abdominal mass lesions identified. 2. Fatty infiltration of the liver. 3. Moderate esophageal hiatal hernia. 4. Aortic atherosclerosis. Electronically Signed   By: Lucienne Capers M.D.   On: 12/24/2021 18:20   CT HEAD WO CONTRAST (5MM)  Result Date: 12/24/2021 CLINICAL DATA:  Chronic or recurrent sinusitis. EXAM: CT HEAD WITHOUT CONTRAST TECHNIQUE: Contiguous axial images were obtained from the base of the skull through the vertex without intravenous contrast. RADIATION DOSE REDUCTION: This exam was performed according to the departmental dose-optimization program which includes automated exposure control, adjustment of the mA and/or kV according to patient size and/or use of iterative  reconstruction technique. COMPARISON:  None Available. FINDINGS: Brain: Diffuse cerebral atrophy. Ventricular dilatation consistent with central atrophy. Low-attenuation changes in the deep white matter consistent with small vessel ischemia. No abnormal extra-axial fluid collections. No mass effect or midline shift. Gray-white matter junctions are distinct. Basal cisterns are not effaced. No acute intracranial hemorrhage. Vascular: No hyperdense vessel or unexpected calcification. Skull: Normal. Negative for fracture or focal lesion. Sinuses/Orbits: Paranasal sinuses and mastoid air cells are clear. Other: None. IMPRESSION: No acute intracranial abnormalities. Chronic atrophy  and small vessel ischemic changes. Electronically Signed   By: Lucienne Capers M.D.   On: 12/24/2021 18:17   DG Chest 2 View  Result Date: 12/24/2021 CLINICAL DATA:  Shortness of breath, recently diagnosed PE EXAM: CHEST - 2 VIEW COMPARISON:  08/23/2018 FINDINGS: Cardiac size is within normal limits. Lung fields are clear of any infiltrate or pulmonary edema. There is no pleural effusion or pneumothorax. Small transverse linear densities in the lateral aspect of left lower lung field may suggest scarring or subsegmental atelectasis. IMPRESSION: There are no signs of pulmonary edema or focal pulmonary consolidation. Small transverse linear densities near the left lateral CP angle suggest minimal scarring or subsegmental atelectasis. Electronically Signed   By: Elmer Picker M.D.   On: 12/24/2021 14:45   CT Angio Chest Pulmonary Embolism (PE) W or WO Contrast  Result Date: 12/24/2021 CLINICAL DATA:  Elevated D-dimer.  Abnormal coagulation profile. EXAM: CT ANGIOGRAPHY CHEST WITH CONTRAST TECHNIQUE: Multidetector CT imaging of the chest was performed using the standard protocol during bolus administration of intravenous contrast. Multiplanar CT image reconstructions and MIPs were obtained to evaluate the vascular anatomy. RADIATION DOSE REDUCTION: This exam was performed according to the departmental dose-optimization program which includes automated exposure control, adjustment of the mA and/or kV according to patient size and/or use of iterative reconstruction technique. CONTRAST:  17m ISOVUE-370 IOPAMIDOL (ISOVUE-370) INJECTION 76% COMPARISON:  CT chest 06/17/2018 FINDINGS: Cardiovascular: Heart size is normal. Aorta and great vessel origins within normal limits. Pulmonary artery opacification is excellent. Multiple filling defects are present. A nonocclusive saddle embolus crosses the main pulmonary artery bifurcation. Clot burden is greater in the right lung than left. Emboli extend into the upper  lobe, middle lobe and lower lobe on the right and both upper and lower lobe on the left. The RV:LV ratio is less than 1 Mediastinum/Nodes: No enlarged mediastinal, hilar, or axillary lymph nodes. Thyroid gland, trachea, and esophagus demonstrate no significant findings. Lungs/Pleura: Focal thickening noted along the major fissure inferiorly on the left. Lungs are otherwise clear. No significant pleural effusion is present. Upper Abdomen: Diffuse fatty infiltration of the liver is now present. The visualized scratched at no discrete lesions are present. The visualized upper abdomen is otherwise within normal limits. Musculoskeletal: No chest wall abnormality. No acute or significant osseous findings. Review of the MIP images confirms the above findings. IMPRESSION: 1. Multiple bilateral pulmonary emboli with a nonocclusive saddle embolus. 2. No evidence for right heart strain. 3. New fatty infiltration of the liver. These results were called by telephone at the time of interpretation on 12/24/2021 at 1:50 pm to provider TTherese Sarah NP, who verbally acknowledged these results. Electronically Signed   By: CSan MorelleM.D.   On: 12/24/2021 14:02        Scheduled Meds:  DULoxetine  60 mg Oral QHS   hydrochlorothiazide  12.5 mg Oral Daily   lisinopril  20 mg Oral Daily   metoprolol succinate  25 mg Oral Daily  mometasone-formoterol  2 puff Inhalation BID   montelukast  10 mg Oral QHS   pantoprazole  40 mg Oral BID   senna  1 tablet Oral BID   Continuous Infusions:  sodium chloride 75 mL/hr at 12/24/21 2241   heparin 1,900 Units/hr (12/25/21 0210)     LOS: 1 day       Phillips Climes, MD [facility]   To contact the attending provider between 7A-7P or the covering provider during after hours 7P-7A, please log into the web site [email or web address] and access using universal [place] password for that web site. If you do not have the password, please call the hospital  operator.  12/25/2021, 1:05 PM

## 2021-12-25 NOTE — Progress Notes (Signed)
Lower extremity venous bilateral study completed.  Preliminary results relayed to Elgergawy, MD.  See CV Proc for preliminary results report.   Diem Pagnotta, RDMS, RVT  

## 2021-12-25 NOTE — Progress Notes (Signed)
  Echocardiogram 2D Echocardiogram has been performed.  Justin English 12/25/2021, 3:18 PM

## 2021-12-26 DIAGNOSIS — I82403 Acute embolism and thrombosis of unspecified deep veins of lower extremity, bilateral: Secondary | ICD-10-CM

## 2021-12-26 DIAGNOSIS — N1831 Chronic kidney disease, stage 3a: Secondary | ICD-10-CM | POA: Diagnosis not present

## 2021-12-26 DIAGNOSIS — I2692 Saddle embolus of pulmonary artery without acute cor pulmonale: Secondary | ICD-10-CM | POA: Diagnosis not present

## 2021-12-26 DIAGNOSIS — K219 Gastro-esophageal reflux disease without esophagitis: Secondary | ICD-10-CM | POA: Diagnosis not present

## 2021-12-26 LAB — CBC
HCT: 41.8 % (ref 39.0–52.0)
Hemoglobin: 14.2 g/dL (ref 13.0–17.0)
MCH: 33.1 pg (ref 26.0–34.0)
MCHC: 34 g/dL (ref 30.0–36.0)
MCV: 97.4 fL (ref 80.0–100.0)
Platelets: 322 10*3/uL (ref 150–400)
RBC: 4.29 MIL/uL (ref 4.22–5.81)
RDW: 11.9 % (ref 11.5–15.5)
WBC: 7.5 10*3/uL (ref 4.0–10.5)
nRBC: 0 % (ref 0.0–0.2)

## 2021-12-26 LAB — BASIC METABOLIC PANEL
Anion gap: 9 (ref 5–15)
BUN: 14 mg/dL (ref 8–23)
CO2: 24 mmol/L (ref 22–32)
Calcium: 8.8 mg/dL — ABNORMAL LOW (ref 8.9–10.3)
Chloride: 105 mmol/L (ref 98–111)
Creatinine, Ser: 1.37 mg/dL — ABNORMAL HIGH (ref 0.61–1.24)
GFR, Estimated: 58 mL/min — ABNORMAL LOW (ref >60)
Glucose, Bld: 122 mg/dL — ABNORMAL HIGH (ref 70–99)
Potassium: 4.1 mmol/L (ref 3.5–5.1)
Sodium: 138 mmol/L (ref 135–145)

## 2021-12-26 LAB — HEPARIN LEVEL (UNFRACTIONATED): Heparin Unfractionated: 0.54 IU/mL (ref 0.30–0.70)

## 2021-12-26 MED ORDER — APIXABAN 5 MG PO TABS: 5.0000 mg | ORAL_TABLET | Freq: Two times a day (BID) | ORAL | Status: DC

## 2021-12-26 MED ORDER — APIXABAN 5 MG PO TABS
10.0000 mg | ORAL_TABLET | Freq: Two times a day (BID) | ORAL | Status: DC
  Administered 2021-12-26: 10 mg via ORAL
  Filled 2021-12-26: qty 2

## 2021-12-26 NOTE — Progress Notes (Signed)
[place] for Heparin Indication: pulmonary embolus  Allergies  Allergen Reactions   Zithromax [Azithromycin] Hives and Itching    ZPAC/ caused hives    Patient Measurements: Height: '6\' 2"'$  (188 cm) Weight: 111.1 kg (244 lb 14.4 oz) IBW/kg (Calculated) : 82.2 Heparin Dosing Weight: 105.3  Vital Signs: Temp: 98 F (36.7 C) (07/29 0829) Temp Source: Oral (07/29 0829) BP: 158/96 (07/29 0829) Pulse Rate: 79 (07/29 0829)  Labs: Recent Labs    12/24/21 1432 12/24/21 2252 12/25/21 0445 12/26/21 0313  HGB 15.7  --  13.9 14.2  HCT 46.6  --  40.9 41.8  PLT 358  --  313 322  LABPROT 13.4  --   --   --   INR 1.0  --   --   --   HEPARINUNFRC  --  0.64 0.57 0.54  CREATININE 1.39*  --  1.36* 1.37*     Estimated Creatinine Clearance: 73.2 mL/min (A) (by C-G formula based on SCr of 1.37 mg/dL (H)).  Assessment: 88 yom with a history of HTN, anxiety, acid reflux. Patient presented with SOB. Pt had CT done outpt w/ multiple PEs. Initiated on heparin while admitted, Pharmacy consulted for switch to apixaban in preparation for discharge.   CTA PE w/ Multiple bilateral pulmonary emboli with a nonocclusive saddle embolus. No CT evidence of RHS.  Heparin level therapeutic this AM, CBC stable  Goal of Therapy:  Heparin level 0.3-0.7 units/ml Monitor platelets by anticoagulation protocol: Yes   Plan:  Discontinue heparin 1900 units/hr Initiate Apixaban '10mg'$  BID X 7 days followed by '5mg'$  BID indefinitely Patient provided with apixaban starter pack for discharge and received med education Monitor for s/s of bleeding and s/s of recurrent PE  Titus Dubin, PharmD PGY1 Pharmacy Resident 12/26/2021 10:10 AM

## 2021-12-26 NOTE — Discharge Summary (Signed)
Physician Discharge Summary  Justin English XQJ:[id number] DOB: 1955/09/05 DOA: 12/24/2021  PCP: Everardo Beals, NP  Admit date: 12/24/2021 Discharge date: 12/26/2021  Admitted From: Home Disposition:  Home   Recommendations for Outpatient Follow-up:  Follow up with PCP in 1 weeks Please obtain BMP/CBC in one week Patient need to follow with hematology as an outpatient regarding unprovoked DVT/PE, bilateral referral has been sent to Hematology Patient instructed to  follow with vascular surgery as an outpatient  Home Health:NO  Discharge Condition:Stable CODE STATUS:FULL Diet recommendation: Heart Healthy   Brief/Interim Summary:  Justin English  is a 66 y.o. male, past medical history of CKD stage III A, GERD, asthma GERD, patient was sent by his PCP for PE, patient reports shortness of breath over last few days, has been progressive over couple weeks, went to see the nurse practitioner at his facility, there was a concern about PE, so he was sent for CT angio, which was done today, which was significant for bilateral nonocclusive saddle PE, so he was instructed to come to ED for further management, patient reports dyspnea, denies any fever, chills, cough or chest pain, no leg trauma, recent immobilization, or long distance travel, he denies any nausea, vomiting. -In the ED he had no hypoxia, he is showing sinus rhythm at 95 beats per minutes, creatinine noted to be elevated at 1.39, baseline appears to be around 1.5, patient was started on heparin drip.  2D echo with no evidence of right heart strain, venous Doppler significant for bilateral lower extremity DVT, CT abdomen pelvis was obtained, no acute findings concerning for malignancy.   Pulmonary Embolism / bilateral DVT -Patient presents with dyspnea, his work-up significant for nonocclusive saddle bilateral PE.  -.No clear provoking factors could be identified -2D echo with preserved EF, no evidence of right heart strain -Venous  Dopplers was obtained, with evidence of bilateral DVT, some are acute, but some are of unclear chronicity, with evidence of DVT in the right common femoral, overall he is asymptomatic, with no swelling, pain have discussed with vascular surgery, no acute intervention indicated at this point, but they recommend outpatient follow-up which I have discussed with the patient to call and schedule an appointment with Dr. Standley Dakins next week. -He was treated with heparin GTT for 48 hours given saddle PE, he is transition to Eliquis at time of discharge -No evidence of malignancy on imaging obtained by CT abdomen pelvis, -He appears to be up-to-date on his screening when it comes to endoscopy/colonoscopy. -Quit smoking 7 years ago, no evidence of lung malignancy on imaging -Ambulatory referral has been made to hematology for further work-up   CKD stage III A -Renal function appears to be at baseline, continue with IV fluid has received IV contrast.   GERD -Continue with PPI   History of asthma -No active wheezing, continue with home medications.   Hypertension -Continue with home medication including Toprol-XL, lisinopril/hydrochlorothiazide   Anxiety -Continue with as needed BuSpar and Cymbalta   Prolonged QTc -Avoid prolonging agents       Discharge Diagnoses:  Principal Problem:   Pulmonary embolism (HCC) Active Problems:   Gastroesophageal reflux disease   Stage 3a chronic kidney disease (CKD) [facility])    Discharge Instructions  Discharge Instructions     Ambulatory referral to Hematology / Oncology   Complete by: As directed    Diet - low sodium heart healthy   Complete by: As directed    Discharge instructions   Complete by: As directed  Follow with Primary MD Everardo Beals, NP in 7 days   Get CBC, CMP,  checked  by Primary MD next visit.    Activity: As tolerated with Full fall precautions use walker/cane & assistance as needed   Disposition Home    Diet: Heart  Healthy  , with feeding assistance and aspiration precautions.  For Heart failure patients - Check your Weight same time everyday, if you gain over 2 pounds, or you develop in leg swelling, experience more shortness of breath or chest pain, call your Primary MD immediately. Follow Cardiac Low Salt Diet and 1.5 lit/day fluid restriction.   On your next visit with your primary care physician please Get Medicines reviewed and adjusted.   Please request your Prim.MD to go over all Hospital Tests and Procedure/Radiological results at the follow up, please get all Hospital records sent to your Prim MD by signing hospital release before you go home.   If you experience worsening of your admission symptoms, develop shortness of breath, life threatening emergency, suicidal or homicidal thoughts you must seek medical attention immediately by calling 911 or calling your MD immediately  if symptoms less severe.  You Must read complete instructions/literature along with all the possible adverse reactions/side effects for all the Medicines you take and that have been prescribed to you. Take any new Medicines after you have completely understood and accpet all the possible adverse reactions/side effects.   Do not drive, operating heavy machinery, perform activities at heights, swimming or participation in water activities or provide baby sitting services if your were admitted for syncope or siezures until you have seen by Primary MD or a Neurologist and advised to do so again.  Do not drive when taking Pain medications.    Do not take more than prescribed Pain, Sleep and Anxiety Medications  Special Instructions: If you have smoked or chewed Tobacco  in the last 2 yrs please stop smoking, stop any regular Alcohol  and or any Recreational drug use.  Wear Seat belts while driving.   Please note  You were cared for by a hospitalist during your hospital stay. If you have any questions about your discharge  medications or the care you received while you were in the hospital after you are discharged, you can call the unit and asked to speak with the hospitalist on call if the hospitalist that took care of you is not available. Once you are discharged, your primary care physician will handle any further medical issues. Please note that NO REFILLS for any discharge medications will be authorized once you are discharged, as it is imperative that you return to your primary care physician (or establish a relationship with a primary care physician if you do not have one) for your aftercare needs so that they can reassess your need for medications and monitor your lab values.   Increase activity slowly   Complete by: As directed       Allergies as of 12/26/2021       Reactions   Zithromax [azithromycin] Hives, Itching   ZPAC/ caused hives        Medication List     TAKE these medications    albuterol 108 (90 Base) MCG/ACT inhaler Commonly known as: VENTOLIN HFA Inhale 1-2 puffs into the lungs every 6 (six) hours as needed for wheezing or shortness of breath.   budesonide-formoterol 160-4.5 MCG/ACT inhaler Commonly known as: SYMBICORT Inhale 2 puffs into the lungs 2 (two) times daily.   busPIRone 15  MG tablet Commonly known as: BUSPAR Take 15 mg by mouth 3 (three) times daily as needed.   DULoxetine 60 MG capsule Commonly known as: CYMBALTA Take 60 mg by mouth at bedtime.   Eliquis DVT/PE Starter Pack Generic drug: Apixaban Starter Pack ('10mg'$  and '5mg'$ ) Take as directed on package: start with two-'5mg'$  tablets twice daily for 7 days. On day 8, switch to one-'5mg'$  tablet twice daily.   lisinopril-hydrochlorothiazide 20-12.5 MG tablet Commonly known as: ZESTORETIC Take 1 tablet by mouth daily.   metoprolol succinate 25 MG 24 hr tablet Commonly known as: TOPROL-XL Take 1 tablet (25 mg total) by mouth daily. Pt must make appt with provider for further refills - 1st attempt   pantoprazole 40  MG tablet Commonly known as: PROTONIX TAKE ONE TABLET TWICE DAILY   Qvar 80 MCG/ACT inhaler Generic drug: beclomethasone Inhale 2 puffs into the lungs daily as needed (wheezing or shortness of breath).        Follow-up Information     Cherre Robins, MD Follow up in 1 week(s).   Specialties: Vascular Surgery, Interventional Cardiology Why: Please call to make an appointment within 1 week. Contact information: [address] [place] [address] [phone]                Allergies  Allergen Reactions   Zithromax [Azithromycin] Hives and Itching    ZPAC/ caused hives    Consultations: None   Procedures/Studies: ECHOCARDIOGRAM COMPLETE  Result Date: 12/25/2021    ECHOCARDIOGRAM REPORT   Patient Name:   EZRA DENNE Date of Exam: 12/25/2021 Medical Rec #:  [id number]       Height:       74.0 in Accession #:    [id number]      Weight:       244.9 lb Date of Birth:  Dec 13, 1955       BSA:          2.369 m Patient Age:    66 years        BP:           122/98 mmHg Patient Gender: M               HR:           90 bpm. Exam Location:  Inpatient Procedure: 2D Echo, Cardiac Doppler and Color Doppler Indications:    Pulmonary embolus  History:        Patient has prior history of Echocardiogram examinations, most                 recent 06/19/2018. Risk Factors:Former Smoker. GERD. CKD.  Sonographer:    Clayton Lefort RDCS (AE) Referring Phys: [id number] Yvette Loveless S [facility]  Sonographer Comments: No subcostal window and patient is morbidly obese. IMPRESSIONS  1. Left ventricular ejection fraction, by estimation, is 55 to 60%. The left ventricle has normal function. The left ventricle has no regional wall motion abnormalities. Left ventricular diastolic parameters are consistent with Grade I diastolic dysfunction (impaired relaxation).  2. Right ventricular systolic function is normal. The right ventricular size is normal.  3. The mitral valve is normal in structure. No evidence of mitral valve  regurgitation.  4. The aortic valve is tricuspid. Aortic valve regurgitation is not visualized.  5. 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 FINDINGS  Left Ventricle: Left ventricular ejection fraction, by estimation, is 55 to 60%. The left ventricle has normal function. The left ventricle has no regional wall  motion abnormalities. The left ventricular internal cavity size was normal in size. There is  no left ventricular hypertrophy. Left ventricular diastolic parameters are consistent with Grade I diastolic dysfunction (impaired relaxation). Right Ventricle: The right ventricular size is normal. Right ventricular systolic function is normal. Left Atrium: Left atrial size was normal in size. Right Atrium: Right atrial size was normal in size. Pericardium: There is no evidence of pericardial effusion. Mitral Valve: The mitral valve is normal in structure. No evidence of mitral valve regurgitation. Tricuspid Valve: The tricuspid valve is grossly normal. Tricuspid valve regurgitation is not demonstrated. Aortic Valve: The aortic valve is tricuspid. Aortic valve regurgitation is not visualized. Aortic valve mean gradient measures 3.0 mmHg. Aortic valve peak gradient measures 5.8 mmHg. Aortic valve area, by VTI measures 2.71 cm. Pulmonic Valve: Pulmonic valve regurgitation is not visualized. Aorta: There is mild dilatation of the ascending aorta, measuring 37 mm. IAS/Shunts: No atrial level shunt detected by color flow Doppler.  LEFT VENTRICLE PLAX 2D LVIDd:         4.70 cm   Diastology LVIDs:         3.40 cm   LV e' medial:    5.98 cm/s LV PW:         1.50 cm   LV E/e' medial:  13.2 LV IVS:        0.90 cm   LV e' lateral:   9.68 cm/s LVOT diam:     2.00 cm   LV E/e' lateral: 8.2 LV SV:         55 LV SV Index:   23 LVOT Area:     3.14 cm  RIGHT VENTRICLE RV Basal diam:  2.70 cm RV S prime:     17.40 cm/s TAPSE (M-mode): 2.5 cm LEFT ATRIUM             Index        RIGHT ATRIUM           Index  LA diam:        3.60 cm 1.52 cm/m   RA Area:     21.40 cm LA Vol (A2C):   54.6 ml 23.05 ml/m  RA Volume:   63.80 ml  26.93 ml/m LA Vol (A4C):   47.6 ml 20.09 ml/m LA Biplane Vol: 53.4 ml 22.54 ml/m  AORTIC VALVE AV Area (Vmax):    2.64 cm AV Area (Vmean):   2.57 cm AV Area (VTI):     2.71 cm AV Vmax:           120.00 cm/s AV Vmean:          79.600 cm/s AV VTI:            0.203 m AV Peak Grad:      5.8 mmHg AV Mean Grad:      3.0 mmHg LVOT Vmax:         101.00 cm/s LVOT Vmean:        65.000 cm/s LVOT VTI:          0.175 m LVOT/AV VTI ratio: 0.86  AORTA Ao Root diam: 3.50 cm Ao Asc diam:  3.70 cm MITRAL VALVE               TRICUSPID VALVE MV Area (PHT): 4.83 cm    TR Peak grad:   18.1 mmHg MV Decel Time: 157 msec    TR Vmax:        213.00 cm/s MV E velocity: 78.90 cm/s MV  A velocity: 82.10 cm/s  SHUNTS MV E/A ratio:  0.96        Systemic VTI:  0.18 m                            Systemic Diam: 2.00 cm Phineas Inches Electronically signed by Phineas Inches Signature Date/Time: 12/25/2021/3:26:45 PM    Final    VAS [place] LOWER EXTREMITY VENOUS (DVT)  Result Date: 12/25/2021  Lower Venous DVT Study Patient Name:  JAKYE MULLENS  Date of Exam:   12/25/2021 Medical Rec #: [id number]        Accession #:    [id number] Date of Birth: Dec 07, 1955        Patient Gender: M Patient Age:   66 years Exam Location:  [facility] Procedure:      VAS [place] LOWER EXTREMITY VENOUS (DVT) Referring Phys: Phillips Climes --------------------------------------------------------------------------------  Indications: Pulmonary embolism.  Anticoagulation: Heparin. Limitations: Poor ultrasound/tissue interface. Comparison Study: No prior studies. Performing Technologist: Darlin Coco RDMS, RVT  Examination Guidelines: A complete evaluation includes B-mode imaging, spectral Doppler, color Doppler, and power Doppler as needed of all accessible portions of each vessel. Bilateral testing is considered an integral part of a complete examination.  Limited examinations for reoccurring indications may be performed as noted. The reflux portion of the exam is performed with the patient in reverse Trendelenburg.  +---------+---------------+---------+-----------+----------+-------------------+ RIGHT    CompressibilityPhasicitySpontaneityPropertiesThrombus Aging      +---------+---------------+---------+-----------+----------+-------------------+ CFV      Partial        Yes      Yes                  Age Indeterminate   +---------+---------------+---------+-----------+----------+-------------------+ SFJ      Full                                                             +---------+---------------+---------+-----------+----------+-------------------+ FV Prox  None           No       No                   Age Indeterminate   +---------+---------------+---------+-----------+----------+-------------------+ FV Mid   None           No       No                   Acute               +---------+---------------+---------+-----------+----------+-------------------+ FV DistalNone           No       No                   Acute               +---------+---------------+---------+-----------+----------+-------------------+ PFV                     Yes      Yes                                      +---------+---------------+---------+-----------+----------+-------------------+ POP      None  No       No                   Acute               +---------+---------------+---------+-----------+----------+-------------------+ PTV                     No       No                   Acute               +---------+---------------+---------+-----------+----------+-------------------+ PERO                                                  Not well visualized +---------+---------------+---------+-----------+----------+-------------------+ Gastroc  None           No       No                   Acute                +---------+---------------+---------+-----------+----------+-------------------+   +---------+---------------+---------+-----------+----------+-----------------+ LEFT     CompressibilityPhasicitySpontaneityPropertiesThrombus Aging    +---------+---------------+---------+-----------+----------+-----------------+ CFV      Full           Yes      Yes                                    +---------+---------------+---------+-----------+----------+-----------------+ SFJ      Full                                                           +---------+---------------+---------+-----------+----------+-----------------+ FV Prox  Full                                                           +---------+---------------+---------+-----------+----------+-----------------+ FV Mid   Partial        Yes      Yes                  Age Indeterminate +---------+---------------+---------+-----------+----------+-----------------+ FV DistalPartial        Yes      Yes                  Age Indeterminate +---------+---------------+---------+-----------+----------+-----------------+ PFV      Full                                                           +---------+---------------+---------+-----------+----------+-----------------+ POP      Partial        Yes      Yes                  Age Indeterminate +---------+---------------+---------+-----------+----------+-----------------+  PTV      None           No       No                   Age Indeterminate +---------+---------------+---------+-----------+----------+-----------------+ PERO     None           No       No                   Age Indeterminate +---------+---------------+---------+-----------+----------+-----------------+     Summary: RIGHT: - Findings consistent with acute deep vein thrombosis involving the right femoral vein, right popliteal vein, right gastrocnemius veins, and right posterior tibial veins. - Findings  consistent with age indeterminate deep vein thrombosis involving the right common femoral vein. - No cystic structure found in the popliteal fossa.  LEFT: - Findings consistent with age indeterminate deep vein thrombosis involving the left femoral vein, left popliteal vein, left posterior tibial veins, and left peroneal veins. - No cystic structure found in the popliteal fossa.  *See table(s) above for measurements and observations.    Preliminary    CT ABDOMEN PELVIS WO CONTRAST  Result Date: 12/24/2021 CLINICAL DATA:  Pain.  Recent diagnosis of pulmonary embolus today. EXAM: CT ABDOMEN AND PELVIS WITHOUT CONTRAST TECHNIQUE: Multidetector CT imaging of the abdomen and pelvis was performed following the standard protocol without IV contrast. RADIATION DOSE REDUCTION: This exam was performed according to the departmental dose-optimization program which includes automated exposure control, adjustment of the mA and/or kV according to patient size and/or use of iterative reconstruction technique. COMPARISON:  CT chest 12/24/2021 FINDINGS: Lower chest: Moderate-sized esophageal hiatal hernia. Lung bases are clear. Hepatobiliary: No focal liver abnormality is seen. No gallstones, gallbladder wall thickening, or biliary dilatation. Diffuse fatty infiltration of the liver. Pancreas: Unremarkable. No pancreatic ductal dilatation or surrounding inflammatory changes. Spleen: Normal in size without focal abnormality. Adrenals/Urinary Tract: Adrenal glands are unremarkable. Kidneys are normal, without renal calculi, focal lesion, or hydronephrosis. Bladder is unremarkable. Residual contrast material in the urinary tract. Stomach/Bowel: Stomach is within normal limits. Appendix appears normal. No evidence of bowel wall thickening, distention, or inflammatory changes. Vascular/Lymphatic: Aortic atherosclerosis. No enlarged abdominal or pelvic lymph nodes. Reproductive: Prostate is unremarkable. Other: No abdominal wall hernia  or abnormality. No abdominopelvic ascites. Musculoskeletal: No acute or significant osseous findings. IMPRESSION: 1. No acute process demonstrated in the abdomen or pelvis. No solid organ or abdominal mass lesions identified. 2. Fatty infiltration of the liver. 3. Moderate esophageal hiatal hernia. 4. Aortic atherosclerosis. Electronically Signed   By: Lucienne Capers M.D.   On: 12/24/2021 18:20   CT HEAD WO CONTRAST (5MM)  Result Date: 12/24/2021 CLINICAL DATA:  Chronic or recurrent sinusitis. EXAM: CT HEAD WITHOUT CONTRAST TECHNIQUE: Contiguous axial images were obtained from the base of the skull through the vertex without intravenous contrast. RADIATION DOSE REDUCTION: This exam was performed according to the departmental dose-optimization program which includes automated exposure control, adjustment of the mA and/or kV according to patient size and/or use of iterative reconstruction technique. COMPARISON:  None Available. FINDINGS: Brain: Diffuse cerebral atrophy. Ventricular dilatation consistent with central atrophy. Low-attenuation changes in the deep white matter consistent with small vessel ischemia. No abnormal extra-axial fluid collections. No mass effect or midline shift. Gray-white matter junctions are distinct. Basal cisterns are not effaced. No acute intracranial hemorrhage. Vascular: No hyperdense vessel or unexpected calcification. Skull: Normal. Negative for fracture or focal lesion. Sinuses/Orbits: Paranasal sinuses and  mastoid air cells are clear. Other: None. IMPRESSION: No acute intracranial abnormalities. Chronic atrophy and small vessel ischemic changes. Electronically Signed   By: Lucienne Capers M.D.   On: 12/24/2021 18:17   DG Chest 2 View  Result Date: 12/24/2021 CLINICAL DATA:  Shortness of breath, recently diagnosed PE EXAM: CHEST - 2 VIEW COMPARISON:  08/23/2018 FINDINGS: Cardiac size is within normal limits. Lung fields are clear of any infiltrate or pulmonary edema. There  is no pleural effusion or pneumothorax. Small transverse linear densities in the lateral aspect of left lower lung field may suggest scarring or subsegmental atelectasis. IMPRESSION: There are no signs of pulmonary edema or focal pulmonary consolidation. Small transverse linear densities near the left lateral CP angle suggest minimal scarring or subsegmental atelectasis. Electronically Signed   By: Elmer Picker M.D.   On: 12/24/2021 14:45   CT Angio Chest Pulmonary Embolism (PE) W or WO Contrast  Result Date: 12/24/2021 CLINICAL DATA:  Elevated D-dimer.  Abnormal coagulation profile. EXAM: CT ANGIOGRAPHY CHEST WITH CONTRAST TECHNIQUE: Multidetector CT imaging of the chest was performed using the standard protocol during bolus administration of intravenous contrast. Multiplanar CT image reconstructions and MIPs were obtained to evaluate the vascular anatomy. RADIATION DOSE REDUCTION: This exam was performed according to the departmental dose-optimization program which includes automated exposure control, adjustment of the mA and/or kV according to patient size and/or use of iterative reconstruction technique. CONTRAST:  158m ISOVUE-370 IOPAMIDOL (ISOVUE-370) INJECTION 76% COMPARISON:  CT chest 06/17/2018 FINDINGS: Cardiovascular: Heart size is normal. Aorta and great vessel origins within normal limits. Pulmonary artery opacification is excellent. Multiple filling defects are present. A nonocclusive saddle embolus crosses the main pulmonary artery bifurcation. Clot burden is greater in the right lung than left. Emboli extend into the upper lobe, middle lobe and lower lobe on the right and both upper and lower lobe on the left. The RV:LV ratio is less than 1 Mediastinum/Nodes: No enlarged mediastinal, hilar, or axillary lymph nodes. Thyroid gland, trachea, and esophagus demonstrate no significant findings. Lungs/Pleura: Focal thickening noted along the major fissure inferiorly on the left. Lungs are  otherwise clear. No significant pleural effusion is present. Upper Abdomen: Diffuse fatty infiltration of the liver is now present. The visualized scratched at no discrete lesions are present. The visualized upper abdomen is otherwise within normal limits. Musculoskeletal: No chest wall abnormality. No acute or significant osseous findings. Review of the MIP images confirms the above findings. IMPRESSION: 1. Multiple bilateral pulmonary emboli with a nonocclusive saddle embolus. 2. No evidence for right heart strain. 3. New fatty infiltration of the liver. These results were called by telephone at the time of interpretation on 12/24/2021 at 1:50 pm to provider TTherese Sarah NP, who verbally acknowledged these results. Electronically Signed   By: CSan MorelleM.D.   On: 12/24/2021 14:02      Subjective:  He denies any complaints today Discharge Exam: Vitals:   12/26/21 0812 12/26/21 0829  BP:  (!) 158/96  Pulse:  79  Resp:  16  Temp:  98 F (36.7 C)  SpO2: 96% 95%   Vitals:   12/26/21 0302 12/26/21 0800 12/26/21 0812 12/26/21 0829  BP: (!) 147/94   (!) 158/96  Pulse: 89   79  Resp: 18   16  Temp: 97.9 F (36.6 C) 98 F (36.7 C)  98 F (36.7 C)  TempSrc: Oral Oral  Oral  SpO2: 93%  96% 95%  Weight:      Height:  General: Pt is alert, awake, not in acute distress Cardiovascular: RRR, S1/S2 +, no rubs, no gallops Respiratory: CTA bilaterally, no wheezing, no rhonchi Abdominal: Soft, NT, ND, bowel sounds + Extremities: no edema, no cyanosis    The results of significant diagnostics from this hospitalization (including imaging, microbiology, ancillary and laboratory) are listed below for reference.     Microbiology: No results found for this or any previous visit (from the past 240 hour(s)).   Labs: BNP (last 3 results) Recent Labs    12/24/21 1432  BNP 62.7   Basic Metabolic Panel: Recent Labs  Lab 12/24/21 1432 12/24/21 1452 12/25/21 0445  12/26/21 0313  NA 139  --  136 138  K 3.9  --  3.8 4.1  CL 103  --  103 105  CO2 26  --  24 24  GLUCOSE 133*  --  120* 122*  BUN 11  --  15 14  CREATININE 1.39*  --  1.36* 1.37*  CALCIUM 9.0  --  8.8* 8.8*  MG  --  2.1  --   --    Liver Function Tests: Recent Labs  Lab 12/25/21 0445  AST 26  ALT 38  ALKPHOS 75  BILITOT 1.0  PROT 6.3*  ALBUMIN 3.2*   No results for input(s): "LIPASE", "AMYLASE" in the last 168 hours. No results for input(s): "AMMONIA" in the last 168 hours. CBC: Recent Labs  Lab 12/24/21 1432 12/25/21 0445 12/26/21 0313  WBC 9.2 9.1 7.5  NEUTROABS 6.1  --   --   HGB 15.7 13.9 14.2  HCT 46.6 40.9 41.8  MCV 98.3 98.1 97.4  PLT 358 313 322   Cardiac Enzymes: No results for input(s): "CKTOTAL", "CKMB", "CKMBINDEX", "TROPONINI" in the last 168 hours. BNP: Invalid input(s): "POCBNP" CBG: No results for input(s): "GLUCAP" in the last 168 hours. D-Dimer No results for input(s): "DDIMER" in the last 72 hours. Hgb A1c No results for input(s): "HGBA1C" in the last 72 hours. Lipid Profile No results for input(s): "CHOL", "HDL", "LDLCALC", "TRIG", "CHOLHDL", "LDLDIRECT" in the last 72 hours. Thyroid function studies No results for input(s): "TSH", "T4TOTAL", "T3FREE", "THYROIDAB" in the last 72 hours.  Invalid input(s): "FREET3" Anemia work up No results for input(s): "VITAMINB12", "FOLATE", "FERRITIN", "TIBC", "IRON", "RETICCTPCT" in the last 72 hours. Urinalysis    Component Value Date/Time   COLORURINE COLORLESS (A) 08/05/2016 1236   APPEARANCEUR CLEAR 08/05/2016 1236   LABSPEC 1.001 (L) 08/05/2016 1236   PHURINE 8.0 08/05/2016 1236   GLUCOSEU NEGATIVE 08/05/2016 1236   HGBUR NEGATIVE 08/05/2016 1236   BILIRUBINUR NEGATIVE 08/05/2016 1236   KETONESUR NEGATIVE 08/05/2016 1236   PROTEINUR NEGATIVE 08/05/2016 1236   NITRITE NEGATIVE 08/05/2016 1236   LEUKOCYTESUR NEGATIVE 08/05/2016 1236   Sepsis Labs Recent Labs  Lab 12/24/21 1432  12/25/21 0445 12/26/21 0313  WBC 9.2 9.1 7.5   Microbiology No results found for this or any previous visit (from the past 240 hour(s)).   Time coordinating discharge: Over 30 minutes  SIGNED:   Phillips Climes, MD  [facility] 12/26/2021, 11:12 AM Pager   If 7PM-7AM, please contact night-coverage [email or web address]

## 2022-01-14 ENCOUNTER — Telehealth: Payer: Self-pay | Admitting: Hematology

## 2022-01-14 NOTE — Telephone Encounter (Signed)
R/s pt's new hem appt to an earlier date. Pt is aware of new appt date/time.

## 2022-01-18 ENCOUNTER — Inpatient Hospital Stay: Payer: 59 | Attending: Hematology and Oncology | Admitting: Hematology

## 2022-01-18 ENCOUNTER — Inpatient Hospital Stay: Payer: 59

## 2022-01-18 ENCOUNTER — Other Ambulatory Visit: Payer: Self-pay

## 2022-01-18 VITALS — BP 146/91 | HR 118 | Temp 97.2°F | Resp 15 | Wt 246.9 lb

## 2022-01-18 DIAGNOSIS — I2692 Saddle embolus of pulmonary artery without acute cor pulmonale: Secondary | ICD-10-CM

## 2022-01-18 DIAGNOSIS — I82413 Acute embolism and thrombosis of femoral vein, bilateral: Secondary | ICD-10-CM

## 2022-01-18 DIAGNOSIS — D6859 Other primary thrombophilia: Secondary | ICD-10-CM

## 2022-01-18 DIAGNOSIS — Z87891 Personal history of nicotine dependence: Secondary | ICD-10-CM

## 2022-01-18 DIAGNOSIS — Z7901 Long term (current) use of anticoagulants: Secondary | ICD-10-CM

## 2022-01-18 LAB — CBC WITH DIFFERENTIAL (CANCER CENTER ONLY)
Abs Immature Granulocytes: 0.11 10*3/uL — ABNORMAL HIGH (ref 0.00–0.07)
Basophils Absolute: 0.1 10*3/uL (ref 0.0–0.1)
Basophils Relative: 1 %
Eosinophils Absolute: 0.1 10*3/uL (ref 0.0–0.5)
Eosinophils Relative: 1 %
HCT: 44.1 % (ref 39.0–52.0)
Hemoglobin: 15.2 g/dL (ref 13.0–17.0)
Immature Granulocytes: 1 %
Lymphocytes Relative: 20 %
Lymphs Abs: 1.6 10*3/uL (ref 0.7–4.0)
MCH: 33.6 pg (ref 26.0–34.0)
MCHC: 34.5 g/dL (ref 30.0–36.0)
MCV: 97.6 fL (ref 80.0–100.0)
Monocytes Absolute: 0.7 10*3/uL (ref 0.1–1.0)
Monocytes Relative: 8 %
Neutro Abs: 5.4 10*3/uL (ref 1.7–7.7)
Neutrophils Relative %: 69 %
Platelet Count: 294 10*3/uL (ref 150–400)
RBC: 4.52 MIL/uL (ref 4.22–5.81)
RDW: 12.2 % (ref 11.5–15.5)
WBC Count: 7.9 10*3/uL (ref 4.0–10.5)
nRBC: 0 % (ref 0.0–0.2)

## 2022-01-18 LAB — CMP (CANCER CENTER ONLY)
ALT: 58 U/L — ABNORMAL HIGH (ref 0–44)
AST: 39 U/L (ref 15–41)
Albumin: 4.4 g/dL (ref 3.5–5.0)
Alkaline Phosphatase: 77 U/L (ref 38–126)
Anion gap: 7 (ref 5–15)
BUN: 18 mg/dL (ref 8–23)
CO2: 27 mmol/L (ref 22–32)
Calcium: 9.8 mg/dL (ref 8.9–10.3)
Chloride: 103 mmol/L (ref 98–111)
Creatinine: 1.58 mg/dL — ABNORMAL HIGH (ref 0.61–1.24)
GFR, Estimated: 49 mL/min — ABNORMAL LOW (ref >60)
Glucose, Bld: 128 mg/dL — ABNORMAL HIGH (ref 70–99)
Potassium: 4 mmol/L (ref 3.5–5.1)
Sodium: 137 mmol/L (ref 135–145)
Total Bilirubin: 0.6 mg/dL (ref 0.3–1.2)
Total Protein: 7.4 g/dL (ref 6.5–8.1)

## 2022-01-18 LAB — ANTITHROMBIN III: AntiThromb III Func: 101 % (ref 75–120)

## 2022-01-18 NOTE — Progress Notes (Addendum)
HEMATOLOGY/ONCOLOGY CONSULTATION NOTE  Date of Service: 01/18/2022  Patient Care Team: Everardo Beals, NP as PCP - General Nahser, Wonda Cheng, MD as PCP - Cardiology (Cardiology)  CHIEF COMPLAINTS/PURPOSE OF CONSULTATION:  Acute saddle pulmonary embolism  HISTORY OF PRESENTING ILLNESS:   Justin English is a wonderful 67 y.o. male who has been referred to [place] by Dr Merryl Hacker for evaluation and management of unprovoked acute pulmonary embolism.  Patient has a history of hypertension, GERD, asthma, seasonal allergies, COPD who presented to his primary care physician with progressive shortness of breath worsening over a few days but present for a few weeks progressively.  He had a CT angiogram which showed bilateral nonocclusive saddle PE and he was sent to the emergency room as a result.  In the ED he had no hypoxia and EKG showed normal sinus rhythm. Ultrasound of the lower extremities showed bilateral lower extremity DVT. CT abdomen was done which showed no evidence of overt malignancy.  Patient was referred to hematology to evaluate for hypercoagulable state to determine the etiology of his unprovoked pulmonary embolism.  Patient denies active smoking. Notes no use of testosterone or other hormone supplementation. No acute new focal symptoms to suggest new issues such as malignancy. No family history of PE or DVT or other hypercoagulable state. No recent long distance travel or surgery.   MEDICAL HISTORY:  Past Medical History:  Diagnosis Date   Allergy    seasonal   Anxiety    Asthma    on recent inhaler use   Bronchitis    Diverticulitis    2019 per colonscopy   GERD (gastroesophageal reflux disease)    Hemorrhoids    none recent   Hiatal hernia    Hypertension    Sinus infection    hx of frequent sinus infection    SURGICAL HISTORY: Past Surgical History:  Procedure Laterality Date   colonsscopy  05/23/2018   and endoscopy   INGUINAL HERNIA REPAIR Left     was 66 years old and 15 yrs ago   LEFT HEART CATH AND CORONARY ANGIOGRAPHY N/A 06/19/2018   Procedure: LEFT HEART CATH AND CORONARY ANGIOGRAPHY;  Surgeon: Lorretta Harp, MD;  Location: [facility];  Service: Cardiovascular;  Laterality: N/A;   UMBILICAL HERNIA REPAIR N/A 05/23/2019   Procedure: LAPAROSCOPIC UMBILICAL HERNIA REPAIR WITH MESH;  Surgeon: Ralene Ok, MD;  Location: [place];  Service: General;  Laterality: N/A;    SOCIAL HISTORY: Social History   Socioeconomic History   Marital status: Married    Spouse name: Not on file   Number of children: 3   Years of education: Not on file   Highest education level: Not on file  Occupational History   Occupation: color tech  Tobacco Use   Smoking status: Former    Types: Cigarettes    Quit date: 03/01/2015    Years since quitting: 6.8   Smokeless tobacco: Never  Vaping Use   Vaping Use: Never used  Substance and Sexual Activity   Alcohol use: Not Currently   Drug use: No   Sexual activity: Not on file  Other Topics Concern   Not on file  Social History Narrative   Not on file   Social Determinants of Health   Financial Resource Strain: Not on file  Food Insecurity: Not on file  Transportation Needs: Not on file  Physical Activity: Not on file  Stress: Not on file  Social Connections: Not on file  Intimate Partner Violence: Not on file    FAMILY HISTORY: Family History  Problem Relation Age of Onset   Bronchitis Mother    Prostate cancer Father    Lung cancer Father    Arthritis Brother    Colon cancer Neg Hx    Esophageal cancer Neg Hx    Inflammatory bowel disease Neg Hx    Liver disease Neg Hx    Pancreatic cancer Neg Hx    Rectal cancer Neg Hx    Stomach cancer Neg Hx     ALLERGIES:  is allergic to zithromax [azithromycin].  MEDICATIONS:  Current Outpatient Medications  Medication Sig Dispense Refill   albuterol (PROVENTIL HFA;VENTOLIN HFA) 108 (90 Base) MCG/ACT  inhaler Inhale 1-2 puffs into the lungs every 6 (six) hours as needed for wheezing or shortness of breath. 1 Inhaler 0   APIXABAN (ELIQUIS) VTE STARTER PACK (10MG AND 5MG) Take as directed on package: start with two-26m tablets twice daily for 7 days. On day 8, switch to one-544mtablet twice daily. 74 each 0   budesonide-formoterol (SYMBICORT) 160-4.5 MCG/ACT inhaler Inhale 2 puffs into the lungs 2 (two) times daily.     busPIRone (BUSPAR) 15 MG tablet Take 15 mg by mouth 3 (three) times daily as needed.     DULoxetine (CYMBALTA) 60 MG capsule Take 60 mg by mouth at bedtime.      lisinopril-hydrochlorothiazide (ZESTORETIC) 20-12.5 MG tablet Take 1 tablet by mouth daily.     metoprolol succinate (TOPROL-XL) 25 MG 24 hr tablet Take 1 tablet (25 mg total) by mouth daily. Pt must make appt with provider for further refills - 1st attempt 30 tablet 0   pantoprazole (PROTONIX) 40 MG tablet TAKE ONE TABLET TWICE DAILY 60 tablet 4   QVAR 80 MCG/ACT inhaler Inhale 2 puffs into the lungs daily as needed (wheezing or shortness of breath).  (Patient not taking: Reported on 01/18/2022)     No current facility-administered medications for this visit.    REVIEW OF SYSTEMS:    10 Point review of Systems was done is negative except as noted above.  PHYSICAL EXAMINATION: ECOG PERFORMANCE STATUS: 1 - Symptomatic but completely ambulatory  . Vitals:   01/18/22 1435  BP: (!) 146/91  Pulse: (!) 118  Resp: 15  Temp: (!) 97.2 F (36.2 C)  SpO2: 96%   Filed Weights   01/18/22 1435  Weight: 246 lb 14.4 oz (112 kg)   .Body mass index is 31.7 kg/m.  GENERAL:alert, in no acute distress and comfortable SKIN: no acute rashes, no significant lesions EYES: conjunctiva are pink and non-injected, sclera anicteric OROPHARYNX: MMM, no exudates, no oropharyngeal erythema or ulceration NECK: supple, no JVD LYMPH:  no palpable lymphadenopathy in the cervical, axillary or inguinal regions LUNGS: clear to  auscultation b/l with normal respiratory effort HEART: regular rate & rhythm ABDOMEN:  normoactive bowel sounds , non tender, not distended. Extremity: no pedal edema PSYCH: alert & oriented x 3 with fluent speech NEURO: no focal motor/sensory deficits  LABORATORY DATA:  I have reviewed the data as listed  .    Latest Ref Rng & Units 12/26/2021    3:13 AM 12/25/2021    4:45 AM 12/24/2021    2:32 PM  CBC  WBC 4.0 - 10.5 K/uL 7.5  9.1  9.2   Hemoglobin 13.0 - 17.0 g/dL 14.2  13.9  15.7   Hematocrit 39.0 - 52.0 % 41.8  40.9  46.6   Platelets 150 - 400 K/uL 322  313  358     .    Latest Ref Rng & Units 12/26/2021    3:13 AM 12/25/2021    4:45 AM 12/24/2021    2:32 PM  CMP  Glucose 70 - 99 mg/dL 122  120  133   BUN 8 - 23 mg/dL _0 Creatinine 0.61 - 1.24 mg/dL 1.37  1.36  1.39   Sodium 135 - 145 mmol/L 138  136  139   Potassium 3.5 - 5.1 mmol/L 4.1  3.8  3.9   Chloride 98 - 111 mmol/L 105  103  103   CO2 22 - 32 mmol/L _1 Calcium 8.9 - 10.3 mg/dL 8.8  8.8  9.0   Total Protein 6.5 - 8.1 g/dL  6.3    Total Bilirubin 0.3 - 1.2 mg/dL  1.0    Alkaline Phos 38 - 126 U/L  75    AST 15 - 41 U/L  26    ALT 0 - 44 U/L  38       RADIOGRAPHIC STUDIES: I have personally reviewed the radiological images as listed and agreed with the findings in the report. VAS [place] LOWER EXTREMITY VENOUS (DVT)  Result Date: 12/26/2021  Lower Venous DVT Study Patient Name:  Justin English  Date of Exam:   12/25/2021 Medical Rec #: [id number]        Accession #:    [id number] Date of Birth: [birth year]        Patient Gender: M Patient Age:   89 years Exam Location:  [facility] Procedure:      VAS [place] LOWER EXTREMITY VENOUS (DVT) Referring Phys: Phillips Climes --------------------------------------------------------------------------------  Indications: Pulmonary embolism.  Anticoagulation: Heparin. Limitations: Poor ultrasound/tissue interface. Comparison Study: No prior studies.  Performing Technologist: Darlin Coco RDMS, RVT  Examination Guidelines: A complete evaluation includes B-mode imaging, spectral Doppler, color Doppler, and power Doppler as needed of all accessible portions of each vessel. Bilateral testing is considered an integral part of a complete examination. Limited examinations for reoccurring indications may be performed as noted. The reflux portion of the exam is performed with the patient in reverse Trendelenburg.  +---------+---------------+---------+-----------+----------+-------------------+ RIGHT    CompressibilityPhasicitySpontaneityPropertiesThrombus Aging      +---------+---------------+---------+-----------+----------+-------------------+ CFV      Partial        Yes      Yes                  Age Indeterminate   +---------+---------------+---------+-----------+----------+-------------------+ SFJ      Full                                                             +---------+---------------+---------+-----------+----------+-------------------+ FV Prox  None           No       No                   Age Indeterminate   +---------+---------------+---------+-----------+----------+-------------------+ FV Mid   None           No       No                   Acute               +---------+---------------+---------+-----------+----------+-------------------+  FV DistalNone           No       No                   Acute               +---------+---------------+---------+-----------+----------+-------------------+ PFV                     Yes      Yes                                      +---------+---------------+---------+-----------+----------+-------------------+ POP      None           No       No                   Acute               +---------+---------------+---------+-----------+----------+-------------------+ PTV                     No       No                   Acute                +---------+---------------+---------+-----------+----------+-------------------+ PERO                                                  Not well visualized +---------+---------------+---------+-----------+----------+-------------------+ Gastroc  None           No       No                   Acute               +---------+---------------+---------+-----------+----------+-------------------+   +---------+---------------+---------+-----------+----------+-----------------+ LEFT     CompressibilityPhasicitySpontaneityPropertiesThrombus Aging    +---------+---------------+---------+-----------+----------+-----------------+ CFV      Full           Yes      Yes                                    +---------+---------------+---------+-----------+----------+-----------------+ SFJ      Full                                                           +---------+---------------+---------+-----------+----------+-----------------+ FV Prox  Full                                                           +---------+---------------+---------+-----------+----------+-----------------+ FV Mid   Partial        Yes      Yes                  Age Indeterminate +---------+---------------+---------+-----------+----------+-----------------+ FV DistalPartial  Yes      Yes                  Age Indeterminate +---------+---------------+---------+-----------+----------+-----------------+ PFV      Full                                                           +---------+---------------+---------+-----------+----------+-----------------+ POP      Partial        Yes      Yes                  Age Indeterminate +---------+---------------+---------+-----------+----------+-----------------+ PTV      None           No       No                   Age Indeterminate +---------+---------------+---------+-----------+----------+-----------------+ PERO     None           No       No                    Age Indeterminate +---------+---------------+---------+-----------+----------+-----------------+     Summary: RIGHT: - Findings consistent with acute deep vein thrombosis involving the right femoral vein, right popliteal vein, right gastrocnemius veins, and right posterior tibial veins. - Findings consistent with age indeterminate deep vein thrombosis involving the right common femoral vein. - No cystic structure found in the popliteal fossa.  LEFT: - Findings consistent with age indeterminate deep vein thrombosis involving the left femoral vein, left popliteal vein, left posterior tibial veins, and left peroneal veins. - No cystic structure found in the popliteal fossa.  *See table(s) above for measurements and observations. Electronically signed by Jamelle Haring on 12/26/2021 at 11:48:55 AM.    Final    ECHOCARDIOGRAM COMPLETE  Result Date: 12/25/2021    ECHOCARDIOGRAM REPORT   Patient Name:   Justin English Date of Exam: 12/25/2021 Medical Rec #:  [id number]       Height:       74.0 in Accession #:    [id number]      Weight:       244.9 lb Date of Birth:  01-12-1956       BSA:          2.369 m Patient Age:    88 years        BP:           122/98 mmHg Patient Gender: M               HR:           90 bpm. Exam Location:  Inpatient Procedure: 2D Echo, Cardiac Doppler and Color Doppler Indications:    Pulmonary embolus  History:        Patient has prior history of Echocardiogram examinations, most                 recent 06/19/2018. Risk Factors:Former Smoker. GERD. CKD.  Sonographer:    Clayton Lefort RDCS (AE) Referring Phys: [id number] [address] S [facility]  Sonographer Comments: No subcostal window and patient is morbidly obese. IMPRESSIONS  1. Left ventricular ejection fraction, by estimation, is 55 to 60%. The left ventricle has normal function. The left ventricle has no regional wall motion abnormalities. Left ventricular diastolic  parameters are consistent with Grade I diastolic dysfunction (impaired  relaxation).  2. Right ventricular systolic function is normal. The right ventricular size is normal.  3. The mitral valve is normal in structure. No evidence of mitral valve regurgitation.  4. The aortic valve is tricuspid. Aortic valve regurgitation is not visualized.  5. 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 FINDINGS  Left Ventricle: Left ventricular ejection fraction, by estimation, is 55 to 60%. The left ventricle has normal function. The left ventricle has no regional wall motion abnormalities. The left ventricular internal cavity size was normal in size. There is  no left ventricular hypertrophy. Left ventricular diastolic parameters are consistent with Grade I diastolic dysfunction (impaired relaxation). Right Ventricle: The right ventricular size is normal. Right ventricular systolic function is normal. Left Atrium: Left atrial size was normal in size. Right Atrium: Right atrial size was normal in size. Pericardium: There is no evidence of pericardial effusion. Mitral Valve: The mitral valve is normal in structure. No evidence of mitral valve regurgitation. Tricuspid Valve: The tricuspid valve is grossly normal. Tricuspid valve regurgitation is not demonstrated. Aortic Valve: The aortic valve is tricuspid. Aortic valve regurgitation is not visualized. Aortic valve mean gradient measures 3.0 mmHg. Aortic valve peak gradient measures 5.8 mmHg. Aortic valve area, by VTI measures 2.71 cm. Pulmonic Valve: Pulmonic valve regurgitation is not visualized. Aorta: There is mild dilatation of the ascending aorta, measuring 37 mm. IAS/Shunts: No atrial level shunt detected by color flow Doppler.  LEFT VENTRICLE PLAX 2D LVIDd:         4.70 cm   Diastology LVIDs:         3.40 cm   LV e' medial:    5.98 cm/s LV PW:         1.50 cm   LV E/e' medial:  13.2 LV IVS:        0.90 cm   LV e' lateral:   9.68 cm/s LVOT diam:     2.00 cm   LV E/e' lateral: 8.2 LV SV:         55 LV SV Index:   23 LVOT  Area:     3.14 cm  RIGHT VENTRICLE RV Basal diam:  2.70 cm RV S prime:     17.40 cm/s TAPSE (M-mode): 2.5 cm LEFT ATRIUM             Index        RIGHT ATRIUM           Index LA diam:        3.60 cm 1.52 cm/m   RA Area:     21.40 cm LA Vol (A2C):   54.6 ml 23.05 ml/m  RA Volume:   63.80 ml  26.93 ml/m LA Vol (A4C):   47.6 ml 20.09 ml/m LA Biplane Vol: 53.4 ml 22.54 ml/m  AORTIC VALVE AV Area (Vmax):    2.64 cm AV Area (Vmean):   2.57 cm AV Area (VTI):     2.71 cm AV Vmax:           120.00 cm/s AV Vmean:          79.600 cm/s AV VTI:            0.203 m AV Peak Grad:      5.8 mmHg AV Mean Grad:      3.0 mmHg LVOT Vmax:         101.00 cm/s LVOT Vmean:  65.000 cm/s LVOT VTI:          0.175 m LVOT/AV VTI ratio: 0.86  AORTA Ao Root diam: 3.50 cm Ao Asc diam:  3.70 cm MITRAL VALVE               TRICUSPID VALVE MV Area (PHT): 4.83 cm    TR Peak grad:   18.1 mmHg MV Decel Time: 157 msec    TR Vmax:        213.00 cm/s MV E velocity: 78.90 cm/s MV A velocity: 82.10 cm/s  SHUNTS MV E/A ratio:  0.96        Systemic VTI:  0.18 m                            Systemic Diam: 2.00 cm Phineas Inches Electronically signed by Phineas Inches Signature Date/Time: 12/25/2021/3:26:45 PM    Final    CT ABDOMEN PELVIS WO CONTRAST  Result Date: 12/24/2021 CLINICAL DATA:  Pain.  Recent diagnosis of pulmonary embolus today. EXAM: CT ABDOMEN AND PELVIS WITHOUT CONTRAST TECHNIQUE: Multidetector CT imaging of the abdomen and pelvis was performed following the standard protocol without IV contrast. RADIATION DOSE REDUCTION: This exam was performed according to the departmental dose-optimization program which includes automated exposure control, adjustment of the mA and/or kV according to patient size and/or use of iterative reconstruction technique. COMPARISON:  CT chest 12/24/2021 FINDINGS: Lower chest: Moderate-sized esophageal hiatal hernia. Lung bases are clear. Hepatobiliary: No focal liver abnormality is seen. No gallstones,  gallbladder wall thickening, or biliary dilatation. Diffuse fatty infiltration of the liver. Pancreas: Unremarkable. No pancreatic ductal dilatation or surrounding inflammatory changes. Spleen: Normal in size without focal abnormality. Adrenals/Urinary Tract: Adrenal glands are unremarkable. Kidneys are normal, without renal calculi, focal lesion, or hydronephrosis. Bladder is unremarkable. Residual contrast material in the urinary tract. Stomach/Bowel: Stomach is within normal limits. Appendix appears normal. No evidence of bowel wall thickening, distention, or inflammatory changes. Vascular/Lymphatic: Aortic atherosclerosis. No enlarged abdominal or pelvic lymph nodes. Reproductive: Prostate is unremarkable. Other: No abdominal wall hernia or abnormality. No abdominopelvic ascites. Musculoskeletal: No acute or significant osseous findings. IMPRESSION: 1. No acute process demonstrated in the abdomen or pelvis. No solid organ or abdominal mass lesions identified. 2. Fatty infiltration of the liver. 3. Moderate esophageal hiatal hernia. 4. Aortic atherosclerosis. Electronically Signed   By: Lucienne Capers M.D.   On: 12/24/2021 18:20   CT HEAD WO CONTRAST (5MM)  Result Date: 12/24/2021 CLINICAL DATA:  Chronic or recurrent sinusitis. EXAM: CT HEAD WITHOUT CONTRAST TECHNIQUE: Contiguous axial images were obtained from the base of the skull through the vertex without intravenous contrast. RADIATION DOSE REDUCTION: This exam was performed according to the departmental dose-optimization program which includes automated exposure control, adjustment of the mA and/or kV according to patient size and/or use of iterative reconstruction technique. COMPARISON:  None Available. FINDINGS: Brain: Diffuse cerebral atrophy. Ventricular dilatation consistent with central atrophy. Low-attenuation changes in the deep white matter consistent with small vessel ischemia. No abnormal extra-axial fluid collections. No mass effect or  midline shift. Gray-white matter junctions are distinct. Basal cisterns are not effaced. No acute intracranial hemorrhage. Vascular: No hyperdense vessel or unexpected calcification. Skull: Normal. Negative for fracture or focal lesion. Sinuses/Orbits: Paranasal sinuses and mastoid air cells are clear. Other: None. IMPRESSION: No acute intracranial abnormalities. Chronic atrophy and small vessel ischemic changes. Electronically Signed   By: Lucienne Capers M.D.   On: 12/24/2021 18:17  DG Chest 2 View  Result Date: 12/24/2021 CLINICAL DATA:  Shortness of breath, recently diagnosed PE EXAM: CHEST - 2 VIEW COMPARISON:  08/23/2018 FINDINGS: Cardiac size is within normal limits. Lung fields are clear of any infiltrate or pulmonary edema. There is no pleural effusion or pneumothorax. Small transverse linear densities in the lateral aspect of left lower lung field may suggest scarring or subsegmental atelectasis. IMPRESSION: There are no signs of pulmonary edema or focal pulmonary consolidation. Small transverse linear densities near the left lateral CP angle suggest minimal scarring or subsegmental atelectasis. Electronically Signed   By: Elmer Picker M.D.   On: 12/24/2021 14:45   CT Angio Chest Pulmonary Embolism (PE) W or WO Contrast  Result Date: 12/24/2021 CLINICAL DATA:  Elevated D-dimer.  Abnormal coagulation profile. EXAM: CT ANGIOGRAPHY CHEST WITH CONTRAST TECHNIQUE: Multidetector CT imaging of the chest was performed using the standard protocol during bolus administration of intravenous contrast. Multiplanar CT image reconstructions and MIPs were obtained to evaluate the vascular anatomy. RADIATION DOSE REDUCTION: This exam was performed according to the departmental dose-optimization program which includes automated exposure control, adjustment of the mA and/or kV according to patient size and/or use of iterative reconstruction technique. CONTRAST:  141m ISOVUE-370 IOPAMIDOL (ISOVUE-370)  INJECTION 76% COMPARISON:  CT chest 06/17/2018 FINDINGS: Cardiovascular: Heart size is normal. Aorta and great vessel origins within normal limits. Pulmonary artery opacification is excellent. Multiple filling defects are present. A nonocclusive saddle embolus crosses the main pulmonary artery bifurcation. Clot burden is greater in the right lung than left. Emboli extend into the upper lobe, middle lobe and lower lobe on the right and both upper and lower lobe on the left. The RV:LV ratio is less than 1 Mediastinum/Nodes: No enlarged mediastinal, hilar, or axillary lymph nodes. Thyroid gland, trachea, and esophagus demonstrate no significant findings. Lungs/Pleura: Focal thickening noted along the major fissure inferiorly on the left. Lungs are otherwise clear. No significant pleural effusion is present. Upper Abdomen: Diffuse fatty infiltration of the liver is now present. The visualized scratched at no discrete lesions are present. The visualized upper abdomen is otherwise within normal limits. Musculoskeletal: No chest wall abnormality. No acute or significant osseous findings. Review of the MIP images confirms the above findings. IMPRESSION: 1. Multiple bilateral pulmonary emboli with a nonocclusive saddle embolus. 2. No evidence for right heart strain. 3. New fatty infiltration of the liver. These results were called by telephone at the time of interpretation on 12/24/2021 at 1:50 pm to provider TTherese Sarah NP, who verbally acknowledged these results. Electronically Signed   By: CSan MorelleM.D.   On: 12/24/2021 14:02    ASSESSMENT & PLAN:   66year old male with  #1 acute nonocclusive saddle pulmonary embolism #2 bilateral lower extremity DVTs Plan -Patient extensive medical records were reviewed with him in detail and confirmed. -He currently notes no acute shortness of breath lower extremity pain or swelling. -Recommended using compression socks. Discussed interventions to try to reduce  the risk of subsequent blood clots including staying well-hydrated and using compression socks avoid sitting for long periods of time on the ground with cross legs. -We discussed that given his event is unprovoked irrespective of work-up for hypercoagulable states he would need to be on long-term anticoagulation due to high risk of recurrent clotting without this. -He will continue to be on Eliquis 5 mg p.o. twice daily -We did send out a hypercoagulability work-up as noted below.. -Due to patient's blood counts are normal we will send out a  MPN panel to rule out any subclinical evidence of myeloproliferative syndrome that could also cause hypercoagulable state. -Patient was recommended to follow-up with his primary care physician for age-appropriate cancer screening.  His CT chest abdomen pelvis has not shown any overt evidence of malignancy..   Orders Placed This Encounter  Procedures   CBC with Differential ([place] Only)    Standing Status:   Future    Number of Occurrences:   1    Standing Expiration Date:   01/19/2023   CMP ([place] only)    Standing Status:   Future    Number of Occurrences:   1    Standing Expiration Date:   01/19/2023   PSA, total and free    Standing Status:   Future    Number of Occurrences:   1    Standing Expiration Date:   01/18/2023   Multiple Myeloma Panel (SPEP&IFE w/QIG)    Standing Status:   Future    Number of Occurrences:   1    Standing Expiration Date:   01/19/2023   Kappa/lambda light chains    Standing Status:   Future    Number of Occurrences:   1    Standing Expiration Date:   01/19/2023   Antithrombin III    Standing Status:   Future    Number of Occurrences:   1    Standing Expiration Date:   01/19/2023   Protein C activity    Standing Status:   Future    Number of Occurrences:   1    Standing Expiration Date:   01/19/2023   Protein C, total    Standing Status:   Future    Number of Occurrences:   1    Standing Expiration  Date:   01/19/2023   Protein S activity    Standing Status:   Future    Number of Occurrences:   1    Standing Expiration Date:   01/19/2023   Protein S, total    Standing Status:   Future    Number of Occurrences:   1    Standing Expiration Date:   01/19/2023   Lupus anticoagulant panel    Standing Status:   Future    Number of Occurrences:   1    Standing Expiration Date:   01/19/2023   Beta-2-glycoprotein i abs, IgG/M/A    Standing Status:   Future    Number of Occurrences:   1    Standing Expiration Date:   01/19/2023   Homocysteine, serum    Standing Status:   Future    Number of Occurrences:   1    Standing Expiration Date:   01/19/2023   Factor 5 leiden    Standing Status:   Future    Number of Occurrences:   1    Standing Expiration Date:   01/19/2023   Prothrombin gene mutation    Standing Status:   Future    Number of Occurrences:   1    Standing Expiration Date:   01/19/2023   Cardiolipin antibodies, IgG, IgM, IgA    Standing Status:   Future    Number of Occurrences:   1    Standing Expiration Date:   01/19/2023   JAK2 (including V617F and Exon 12), MPL, and CALR-Next Generation Sequencing    Standing Status:   Future    Number of Occurrences:   1    Standing Expiration Date:   01/19/2023  All of the patients questions were answered with apparent satisfaction. The patient knows to call the clinic with any problems, questions or concerns.  I spent 40 minutes counseling the patient face to face. The total time spent in the appointment was 60 minutes and more than 50% was on counseling and direct patient cares.    Sullivan Lone MD [place] AAHIVMS [facility] Hematology/Oncology Physician [facility]  (Office):       [phone] (Work cell):  [phone] (Fax):           [phone]  01/18/2022 3:07 PM

## 2022-01-19 ENCOUNTER — Inpatient Hospital Stay: Payer: 59

## 2022-01-19 ENCOUNTER — Inpatient Hospital Stay: Payer: 59 | Admitting: Hematology and Oncology

## 2022-01-19 LAB — PSA, TOTAL AND FREE
PSA, Free Pct: 12 %
PSA, Free: 0.12 ng/mL
Prostate Specific Ag, Serum: 1 ng/mL (ref 0.0–4.0)

## 2022-01-19 LAB — CARDIOLIPIN ANTIBODIES, IGG, IGM, IGA
Anticardiolipin IgA: <9 APL U/mL (ref 0–11)
Anticardiolipin IgG: <9 GPL U/mL (ref 0–14)
Anticardiolipin IgM: <9 MPL U/mL (ref 0–12)

## 2022-01-19 LAB — PROTEIN C, TOTAL: Protein C, Total: 101 % (ref 60–150)

## 2022-01-19 LAB — HOMOCYSTEINE: Homocysteine: 23.3 umol/L — ABNORMAL HIGH (ref 0.0–17.2)

## 2022-01-19 LAB — BETA-2-GLYCOPROTEIN I ABS, IGG/M/A
Beta-2 Glyco I IgG: <9 GPI IgG units (ref 0–20)
Beta-2-Glycoprotein I IgA: <9 GPI IgA units (ref 0–25)
Beta-2-Glycoprotein I IgM: <9 GPI IgM units (ref 0–32)

## 2022-01-21 ENCOUNTER — Telehealth: Payer: Self-pay | Admitting: Hematology

## 2022-01-21 NOTE — Telephone Encounter (Signed)
Scheduled follow-up appointment per 8/21 los. Patient is aware. 

## 2022-01-22 LAB — DRVVT MIX: dRVVT Mix: 62.2 s — ABNORMAL HIGH (ref 0.0–40.4)

## 2022-01-22 LAB — DRVVT CONFIRM: dRVVT Confirm: 1.5 ratio — ABNORMAL HIGH (ref 0.8–1.2)

## 2022-01-22 LAB — KAPPA/LAMBDA LIGHT CHAINS
Kappa free light chain: 31.7 mg/L — ABNORMAL HIGH (ref 3.3–19.4)
Kappa, lambda light chain ratio: 1.23 (ref 0.26–1.65)
Lambda free light chains: 25.8 mg/L (ref 5.7–26.3)

## 2022-01-22 LAB — PROTEIN S ACTIVITY: Protein S Activity: 108 % (ref 63–140)

## 2022-01-22 LAB — PROTEIN C ACTIVITY: Protein C Activity: 138 % (ref 73–180)

## 2022-01-22 LAB — LUPUS ANTICOAGULANT PANEL
DRVVT: 109.6 s — ABNORMAL HIGH (ref 0.0–47.0)
PTT Lupus Anticoagulant: 36.5 s (ref 0.0–43.5)

## 2022-01-22 LAB — PROTEIN S, TOTAL: Protein S Ag, Total: 205 % — ABNORMAL HIGH (ref 60–150)

## 2022-01-25 ENCOUNTER — Other Ambulatory Visit: Payer: Self-pay

## 2022-01-25 DIAGNOSIS — I2692 Saddle embolus of pulmonary artery without acute cor pulmonale: Secondary | ICD-10-CM

## 2022-01-25 LAB — MULTIPLE MYELOMA PANEL, SERUM
Albumin SerPl Elph-Mcnc: 3.8 g/dL (ref 2.9–4.4)
Albumin/Glob SerPl: 1.2 (ref 0.7–1.7)
Alpha 1: 0.2 g/dL (ref 0.0–0.4)
Alpha2 Glob SerPl Elph-Mcnc: 0.8 g/dL (ref 0.4–1.0)
B-Globulin SerPl Elph-Mcnc: 1.4 g/dL — ABNORMAL HIGH (ref 0.7–1.3)
Gamma Glob SerPl Elph-Mcnc: 0.9 g/dL (ref 0.4–1.8)
Globulin, Total: 3.4 g/dL (ref 2.2–3.9)
IgA: 327 mg/dL (ref 61–437)
IgG (Immunoglobin G), Serum: 1079 mg/dL (ref 603–1613)
IgM (Immunoglobulin M), Srm: 50 mg/dL (ref 20–172)
Total Protein ELP: 7.2 g/dL (ref 6.0–8.5)

## 2022-01-25 LAB — JAK2 (INCLUDING V617F AND EXON 12), MPL,& CALR-NEXT GEN SEQ

## 2022-01-25 LAB — PROTHROMBIN GENE MUTATION

## 2022-01-25 LAB — FACTOR 5 LEIDEN

## 2022-01-25 MED ORDER — APIXABAN 5 MG PO TABS: 5.0000 mg | ORAL_TABLET | Freq: Two times a day (BID) | ORAL | 3 refills | Status: DC

## 2022-02-02 ENCOUNTER — Inpatient Hospital Stay: Payer: 59 | Attending: Hematology and Oncology | Admitting: Hematology

## 2022-02-02 DIAGNOSIS — I2692 Saddle embolus of pulmonary artery without acute cor pulmonale: Secondary | ICD-10-CM

## 2022-02-02 DIAGNOSIS — I82413 Acute embolism and thrombosis of femoral vein, bilateral: Secondary | ICD-10-CM

## 2022-02-02 NOTE — Progress Notes (Signed)
HEMATOLOGY/ONCOLOGY PHONE VISIT NOTE  Date of Service: 02/02/2022  Patient Care Team: Marva Panda, NP as PCP - General Nahser, Deloris Ping, MD as PCP - Cardiology (Cardiology)  CHIEF COMPLAINTS/PURPOSE OF CONSULTATION:  Discussion of hypercoagulability testing  HISTORY OF PRESENTING ILLNESS:  Justin English is a wonderful 66 y.o. male who has been referred to [place] by Dr Oneita Hurt for evaluation and management of unprovoked acute pulmonary embolism.  Patient has a history of hypertension, GERD, asthma, seasonal allergies, COPD who presented to his primary care physician with progressive shortness of breath worsening over a few days but present for a few weeks progressively.  He had a CT angiogram which showed bilateral nonocclusive saddle PE and he was sent to the emergency room as a result.  In the ED he had no hypoxia and EKG showed normal sinus rhythm. Ultrasound of the lower extremities showed bilateral lower extremity DVT. CT abdomen was done which showed no evidence of overt malignancy.  Patient was referred to hematology to evaluate for hypercoagulable state to determine the etiology of his unprovoked pulmonary embolism.  Patient denies active smoking. Notes no use of testosterone or other hormone supplementation. No acute new focal symptoms to suggest new issues such as malignancy. No family history of PE or DVT or other hypercoagulable state. No recent long distance travel or surgery.  INTERVAL HISTORY:  I connected with Justin English on 02/02/2022 at 3:30 PM by telephone visit and verified that I am speaking with the correct person using two identifiers.   I discussed the limitations, risks, security and privacy concerns of performing an evaluation and management service by telemedicine and the availability of in-person appointments. I also discussed with the patient that there may be a patient responsible charge related to this service. The patient expressed understanding  and agreed to proceed.   Other persons participating in the visit and their role in the encounter: None  Patient's location: Home Provider's location: [facility]  Justin English is a 66 y.o. male who was contacted via phone to discuss his recent anticoagulatively work up. He reports He is doing well with no new symptoms or concerns. No chest pain or shortness of breath. No new leg pain or swelling.  Labs done 01/18/2022 were reviewed in detail as noted below.  MEDICAL HISTORY:  Past Medical History:  Diagnosis Date   Allergy    seasonal   Anxiety    Asthma    on recent inhaler use   Bronchitis    Diverticulitis    2019 per colonscopy   GERD (gastroesophageal reflux disease)    Hemorrhoids    none recent   Hiatal hernia    Hypertension    Sinus infection    hx of frequent sinus infection    SURGICAL HISTORY: Past Surgical History:  Procedure Laterality Date   colonsscopy  05/23/2018   and endoscopy   INGUINAL HERNIA REPAIR Left    was 66 years old and 15 yrs ago   LEFT HEART CATH AND CORONARY ANGIOGRAPHY N/A 06/19/2018   Procedure: LEFT HEART CATH AND CORONARY ANGIOGRAPHY;  Surgeon: Runell Gess, MD;  Location: [facility];  Service: Cardiovascular;  Laterality: N/A;   UMBILICAL HERNIA REPAIR N/A 05/23/2019   Procedure: LAPAROSCOPIC UMBILICAL HERNIA REPAIR WITH MESH;  Surgeon: Axel Filler, MD;  Location: [facility];  Service: General;  Laterality: N/A;    SOCIAL HISTORY: Social History   Socioeconomic History   Marital  status: Married    Spouse name: Not on file   Number of children: 3   Years of education: Not on file   Highest education level: Not on file  Occupational History   Occupation: color tech  Tobacco Use   Smoking status: Former    Types: Cigarettes    Quit date: 03/01/2015    Years since quitting: 6.9   Smokeless tobacco: Never  Vaping Use   Vaping Use: Never used  Substance and Sexual Activity    Alcohol use: Not Currently   Drug use: No   Sexual activity: Not on file  Other Topics Concern   Not on file  Social History Narrative   Not on file   Social Determinants of Health   Financial Resource Strain: Not on file  Food Insecurity: Not on file  Transportation Needs: Not on file  Physical Activity: Not on file  Stress: Not on file  Social Connections: Not on file  Intimate Partner Violence: Not on file    FAMILY HISTORY: Family History  Problem Relation Age of Onset   Bronchitis Mother    Prostate cancer Father    Lung cancer Father    Arthritis Brother    Colon cancer Neg Hx    Esophageal cancer Neg Hx    Inflammatory bowel disease Neg Hx    Liver disease Neg Hx    Pancreatic cancer Neg Hx    Rectal cancer Neg Hx    Stomach cancer Neg Hx     ALLERGIES:  is allergic to zithromax [azithromycin].  MEDICATIONS:  Current Outpatient Medications  Medication Sig Dispense Refill   apixaban (ELIQUIS) 5 MG TABS tablet Take 1 tablet (5 mg total) by mouth 2 (two) times daily. 60 tablet 3   albuterol (PROVENTIL HFA;VENTOLIN HFA) 108 (90 Base) MCG/ACT inhaler Inhale 1-2 puffs into the lungs every 6 (six) hours as needed for wheezing or shortness of breath. 1 Inhaler 0   APIXABAN (ELIQUIS) VTE STARTER PACK ($RemoveBefor'10MG'wcfGXvzXoAbA$  AND $Re'5MG'kUO$ ) Take as directed on package: start with two-$RemoveBefore'5mg'lfBYnPdhNGmcN$  tablets twice daily for 7 days. On day 8, switch to one-$RemoveBefor'5mg'MIItrEjdLITM$  tablet twice daily. 74 each 0   budesonide-formoterol (SYMBICORT) 160-4.5 MCG/ACT inhaler Inhale 2 puffs into the lungs 2 (two) times daily.     busPIRone (BUSPAR) 15 MG tablet Take 15 mg by mouth 3 (three) times daily as needed.     DULoxetine (CYMBALTA) 60 MG capsule Take 60 mg by mouth at bedtime.      lisinopril-hydrochlorothiazide (ZESTORETIC) 20-12.5 MG tablet Take 1 tablet by mouth daily.     metoprolol succinate (TOPROL-XL) 25 MG 24 hr tablet Take 1 tablet (25 mg total) by mouth daily. Pt must make appt with provider for further refills - 1st  attempt 30 tablet 0   pantoprazole (PROTONIX) 40 MG tablet TAKE ONE TABLET TWICE DAILY 60 tablet 4   QVAR 80 MCG/ACT inhaler Inhale 2 puffs into the lungs daily as needed (wheezing or shortness of breath).  (Patient not taking: Reported on 01/18/2022)     No current facility-administered medications for this visit.    REVIEW OF SYSTEMS:    10 Point review of Systems was done is negative except as noted above.  PHYSICAL EXAMINATION: Telemedicine appointment  LABORATORY DATA:  I have reviewed the data as listed  .    Latest Ref Rng & Units 01/18/2022    3:47 PM 12/26/2021    3:13 AM 12/25/2021    4:45 AM  CBC  WBC 4.0 -  10.5 K/uL 7.9  7.5  9.1   Hemoglobin 13.0 - 17.0 g/dL 15.2  14.2  13.9   Hematocrit 39.0 - 52.0 % 44.1  41.8  40.9   Platelets 150 - 400 K/uL 294  322  313     .    Latest Ref Rng & Units 01/18/2022    3:47 PM 12/26/2021    3:13 AM 12/25/2021    4:45 AM  CMP  Glucose 70 - 99 mg/dL 128  122  120   BUN 8 - 23 mg/dL $Remove'18  14  15   'EkQlGEc$ Creatinine 0.61 - 1.24 mg/dL 1.58  1.37  1.36   Sodium 135 - 145 mmol/L 137  138  136   Potassium 3.5 - 5.1 mmol/L 4.0  4.1  3.8   Chloride 98 - 111 mmol/L 103  105  103   CO2 22 - 32 mmol/L $RemoveB'27  24  24   'cyNfAORd$ Calcium 8.9 - 10.3 mg/dL 9.8  8.8  8.8   Total Protein 6.5 - 8.1 g/dL 7.4   6.3   Total Bilirubin 0.3 - 1.2 mg/dL 0.6   1.0   Alkaline Phos 38 - 126 U/L 77   75   AST 15 - 41 U/L 39   26   ALT 0 - 44 U/L 58   38     Recent Results (from the past 2160 hour(s))  Basic metabolic panel     Status: Abnormal   Collection Time: 12/24/21  2:32 PM  Result Value Ref Range   Sodium 139 135 - 145 mmol/L   Potassium 3.9 3.5 - 5.1 mmol/L   Chloride 103 98 - 111 mmol/L   CO2 26 22 - 32 mmol/L   Glucose, Bld 133 (H) 70 - 99 mg/dL    Comment: Glucose reference range applies only to samples taken after fasting for at least 8 hours.   BUN 11 8 - 23 mg/dL   Creatinine, Ser 1.39 (H) 0.61 - 1.24 mg/dL   Calcium 9.0 8.9 - 10.3 mg/dL   GFR,  Estimated 57 (L) >60 mL/min    Comment: (NOTE) Calculated using the CKD-EPI Creatinine Equation (2021)    Anion gap 10 5 - 15    Comment: Performed at [facility] [address], [place], [place] [address]  CBC with Differential     Status: Abnormal   Collection Time: 12/24/21  2:32 PM  Result Value Ref Range   WBC 9.2 4.0 - 10.5 K/uL   RBC 4.74 4.22 - 5.81 MIL/uL   Hemoglobin 15.7 13.0 - 17.0 g/dL   HCT 46.6 39.0 - 52.0 %   MCV 98.3 80.0 - 100.0 fL   MCH 33.1 26.0 - 34.0 pg   MCHC 33.7 30.0 - 36.0 g/dL   RDW 11.9 11.5 - 15.5 %   Platelets 358 150 - 400 K/uL   nRBC 0.0 0.0 - 0.2 %   Neutrophils Relative % 66 %   Neutro Abs 6.1 1.7 - 7.7 K/uL   Lymphocytes Relative 20 %   Lymphs Abs 1.8 0.7 - 4.0 K/uL   Monocytes Relative 9 %   Monocytes Absolute 0.9 0.1 - 1.0 K/uL   Eosinophils Relative 1 %   Eosinophils Absolute 0.1 0.0 - 0.5 K/uL   Basophils Relative 1 %   Basophils Absolute 0.1 0.0 - 0.1 K/uL   Immature Granulocytes 3 %   Abs Immature Granulocytes 0.25 (H) 0.00 - 0.07 K/uL    Comment: Performed at [facility], [place] [address], [place], [place] [address]  Brain natriuretic peptide     Status: None   Collection Time: 12/24/21  2:32 PM  Result Value Ref Range   B Natriuretic Peptide 30.1 0.0 - 100.0 pg/mL    Comment: Performed at [facility] [address], [place], [place] [address]  Protime-INR     Status: None   Collection Time: 12/24/21  2:32 PM  Result Value Ref Range   Prothrombin Time 13.4 11.4 - 15.2 seconds   INR 1.0 0.8 - 1.2    Comment: (NOTE) INR goal varies based on device and disease states. Performed at [facility], [place] [address] [place], [place] [address]   Magnesium     Status: None   Collection Time: 12/24/21  2:52 PM  Result Value Ref Range   Magnesium 2.1 1.7 - 2.4 mg/dL    Comment: Performed at [facility], [place] [address] [place], [place] [address]  Heparin level (unfractionated)     Status: None    Collection Time: 12/24/21 10:52 PM  Result Value Ref Range   Heparin Unfractionated 0.64 0.30 - 0.70 IU/mL    Comment: (NOTE) The clinical reportable range upper limit is being lowered to >1.10 to align with the FDA approved guidance for the current laboratory assay.  If heparin results are below expected values, and patient dosage has  been confirmed, suggest follow up testing of antithrombin III levels. Performed at [facility], [place] [address] [place], [place] [address]   Heparin level (unfractionated)     Status: None   Collection Time: 12/25/21  4:45 AM  Result Value Ref Range   Heparin Unfractionated 0.57 0.30 - 0.70 IU/mL    Comment: (NOTE) The clinical reportable range upper limit is being lowered to >1.10 to align with the FDA approved guidance for the current laboratory assay.  If heparin results are below expected values, and patient dosage has  been confirmed, suggest follow up testing of antithrombin III levels. Performed at [facility], [place] [address] [place], [place] [address]   HIV Antibody (routine testing w rflx)     Status: None   Collection Time: 12/25/21  4:45 AM  Result Value Ref Range   HIV Screen 4th Generation wRfx Non Reactive Non Reactive    Comment: Performed at [facility], [place] [address] [place], [place] [address]  Comprehensive metabolic panel     Status: Abnormal   Collection Time: 12/25/21  4:45 AM  Result Value Ref Range   Sodium 136 135 - 145 mmol/L   Potassium 3.8 3.5 - 5.1 mmol/L   Chloride 103 98 - 111 mmol/L   CO2 24 22 - 32 mmol/L   Glucose, Bld 120 (H) 70 - 99 mg/dL    Comment: Glucose reference range applies only to samples taken after fasting for at least 8 hours.   BUN 15 8 - 23 mg/dL   Creatinine, Ser 1.36 (H) 0.61 - 1.24 mg/dL   Calcium 8.8 (L) 8.9 - 10.3 mg/dL   Total Protein 6.3 (L) 6.5 - 8.1 g/dL   Albumin 3.2 (L) 3.5 - 5.0 g/dL   AST 26 15 - 41 U/L   ALT 38 0 - 44 U/L   Alkaline Phosphatase 75 38  - 126 U/L   Total Bilirubin 1.0 0.3 - 1.2 mg/dL   GFR, Estimated 58 (L) >60 mL/min    Comment: (NOTE) Calculated using the  CKD-EPI Creatinine Equation (2021)    Anion gap 9 5 - 15    Comment: Performed at [facility], [place] [address], [place], [place] [address]  CBC     Status: Abnormal   Collection Time: 12/25/21  4:45 AM  Result Value Ref Range   WBC 9.1 4.0 - 10.5 K/uL   RBC 4.17 (L) 4.22 - 5.81 MIL/uL   Hemoglobin 13.9 13.0 - 17.0 g/dL   HCT 40.9 39.0 - 52.0 %   MCV 98.1 80.0 - 100.0 fL   MCH 33.3 26.0 - 34.0 pg   MCHC 34.0 30.0 - 36.0 g/dL   RDW 11.9 11.5 - 15.5 %   Platelets 313 150 - 400 K/uL   nRBC 0.0 0.0 - 0.2 %    Comment: Performed at [facility], [place] [address] [place], [place] [address]  ECHOCARDIOGRAM COMPLETE     Status: None   Collection Time: 12/25/21  3:18 PM  Result Value Ref Range   Weight 3,918.4 oz   Height 74 in   BP 172/98 mmHg   S' Lateral 3.40 cm   AR max vel 2.64 cm2   AV Area VTI 2.71 cm2   AV Mean grad 3.0 mmHg   AV Peak grad 5.8 mmHg   Ao pk vel 1.20 m/s   Area-P 1/2 4.83 cm2   AV Area mean vel 2.57 cm2  Heparin level (unfractionated)     Status: None   Collection Time: 12/26/21  3:13 AM  Result Value Ref Range   Heparin Unfractionated 0.54 0.30 - 0.70 IU/mL    Comment: (NOTE) The clinical reportable range upper limit is being lowered to >1.10 to align with the FDA approved guidance for the current laboratory assay.  If heparin results are below expected values, and patient dosage has  been confirmed, suggest follow up testing of antithrombin III levels. Performed at [facility], [place] [address], [place] [address]   CBC     Status: None   Collection Time: 12/26/21  3:13 AM  Result Value Ref Range   WBC 7.5 4.0 - 10.5 K/uL   RBC 4.29 4.22 - 5.81 MIL/uL   Hemoglobin 14.2 13.0 - 17.0 g/dL   HCT 41.8 39.0 - 52.0 %   MCV 97.4 80.0 - 100.0 fL   MCH 33.1 26.0 - 34.0 pg   MCHC 34.0 30.0 - 36.0 g/dL   RDW  11.9 11.5 - 15.5 %   Platelets 322 150 - 400 K/uL   nRBC 0.0 0.0 - 0.2 %    Comment: Performed at [facility], [place] [address] [place], [place] [address]  Basic metabolic panel     Status: Abnormal   Collection Time: 12/26/21  3:13 AM  Result Value Ref Range   Sodium 138 135 - 145 mmol/L   Potassium 4.1 3.5 - 5.1 mmol/L   Chloride 105 98 - 111 mmol/L   CO2 24 22 - 32 mmol/L   Glucose, Bld 122 (H) 70 - 99 mg/dL    Comment: Glucose reference range applies only to samples taken after fasting for at least 8 hours.   BUN 14 8 - 23 mg/dL   Creatinine, Ser 1.37 (H) 0.61 - 1.24 mg/dL   Calcium 8.8 (L) 8.9 - 10.3 mg/dL   GFR, Estimated 58 (L) >60 mL/min    Comment: (NOTE) Calculated using the CKD-EPI Creatinine Equation (2021)    Anion gap 9 5 - 15  Comment: Performed at [facility], [place] [address] [place], [place] [address]  JAK2 (including V617F and Exon 12), MPL, and CALR-Next Generation Sequencing     Status: None   Collection Time: 01/18/22  3:46 PM  Result Value Ref Range   JAK 2, MPL, CALR See Scanned report in [place]: Performed at [facility], [address], [place], [place] [address]  Cardiolipin antibodies, IgG, IgM, IgA     Status: None   Collection Time: 01/18/22  3:47 PM  Result Value Ref Range   Anticardiolipin IgG <9 0 - 14 GPL U/mL    Comment: (NOTE)                          Negative:              <15                          Indeterminate:     15 - 20                          Low-Med Positive: >20 - 80                          High Positive:         >80    Anticardiolipin IgM <9 0 - 12 MPL U/mL    Comment: (NOTE)                          Negative:              <13                          Indeterminate:     13 - 20                          Low-Med Positive: >20 - 80                          High Positive:         >80    Anticardiolipin IgA <9 0 - 11 APL U/mL    Comment: (NOTE)                           Negative:              <12                          Indeterminate:     12 - 20                          Low-Med Positive: >20 - 80                          High Positive:         >80 Performed At: [facility] [address] [place], [place] [id number] Rush Farmer MD OI:[id number]   Prothrombin gene mutation     Status: None   Collection Time: 01/18/22  3:47  PM  Result Value Ref Range   Recommendations-PTGENE: Comment     Comment: (NOTE) Result: c.*97G>A - Not Detected This result is not associated with an increased risk for venous thromboembolism. See Additional Clinical Information and Comments. Additional Clinical Information: Venous thromboembolism is a multifactorial disease influenced by genetic, environmental, and circumstantial risk factors. The c.*97G>A variant in the F2 gene is a genetic risk factor for venous thromboembolism. Heterozygous carriers have a 2- to 4-fold increased risk for venous thromboembolism. Homozygotes for the c.*97G>A variant are rare. The annual risk of VTE in homozygotes has been reported to be 1.1%/year. Individuals who carry both a c.*97G>A variant in the F2 gene and a c.1601G>A (p. Arg534Gln) variant in the F5 gene (commonly referred to as Factor V Leiden) have an approximately 20- fold increased risk for venous thromboembolism. Risks are likely to be even higher in more complex genotype combinations involving the F2 c.*97G>A variant and Factor V Leiden (PMID:  19622297). Additional risk factors include but are not limited to: deficiency of protein C, protein S, or antithrombin III, age, male sex, personal or family history of deep vein thromboembolism, smoking, surgery, prolonged immobilization, malignant neoplasm, tamoxifen treatment, raloxifene treatment, oral contraceptive use, hormone replacement therapy, and pregnancy. Management of thrombotic risk and thrombotic events should follow established guidelines and fit the  clinical circumstance. This result cannot predict the occurrence or recurrence of a thrombotic event. Comments: Genetic counseling is recommended to discuss the potential clinical implications of positive results, as well as recommendations for testing family members. Genetic Coordinators are available for health care providers to discuss results at [phone]). Test Details: Variant analyzed: c.*97G>A, previously referred to as G20210A Methods/Limitations: DNA analysis of the F2 gene (NM_000 506.5) was performed by PCR amplification followed by restriction enzyme analysis. The diagnostic sensitivity is >99%. Results must be combined with clinical information for the most accurate interpretation. Molecular-based testing is highly accurate, but as in any laboratory test, diagnostic errors may occur. False positive or false negative results may occur for reasons that include genetic variants, blood transfusions, bone marrow transplantation, somatic or tissue-specific mosaicism, mislabeled samples, or erroneous representation of family relationships. This test was developed and its performance characteristics determined by [facility]. It has not been cleared or approved by the Food and Drug Administration. References: [place]se [place] [facility], [facility] [place] [facility]; [facility] Professional Practice and Guidelines Committee. Addendum: [place] consensus statement on factor V Leiden mutation testing. Genet Med. 2021 Mar 5. doi: 11.9417/E081 [phone]-x. PMID: 44818563. Kristopher Oppenheim. Prothrombin Thrombophilia. 2006 Jul 25 [Updated 2021 Feb 4]. In: Tarri Glenn, Ardinger HH, Pagon RA, et al., editors. GeneReviews(R) [Internet]. [address] ([place]): [place], ; 1993-2021. Available from: https://www.cook-brown.com/ Terrilee Files, Carla Drape, Marin Shutter CS; [facility] Laboratory Quality Assurance Committee.  Venous thromboembolism laboratory testing (factor V Leiden and factor II c.*97G>A), 2018 update: a technical standard of the [place] ([facility]). Genet Med. 2018 Dec;20(12):1489-1498. doi: 14.9702/[id number]-J. Epub 2018 Oct 5. PMID: 28786767.    Reviewed by: Comment     Comment: (NOTE) Technical Component performed at [facility] Professional Component performed by: Melissa A. Martie Round, Ph.D., [facility] Director, Molecular Genetics [place] [id number] Performed At: [place], [place] [id number] Katina Degree MDPhD HU:[id number]   Factor 5 leiden     Status: None   Collection Time: 01/18/22  3:47 PM  Result Value  Ref Range   Recommendations-F5LEID: Comment     Comment: (NOTE) Result: c.1601G>A (p.Arg534Gln) - Not Detected This result is not associated with an increased risk for venous thromboembolism. See Additional Clinical Information and Comments. Additional Clinical Information: Venous thromboembolism is a multifactorial disease influenced by genetic, environmental, and circumstantial risk factors. The c.1601G>A (p. Arg534Gln) variant in the F5 gene, commonly referred to as Factor V Leiden, is a genetic risk factor for venous thromboembolism. Heterozygous carriers of this variant have a 6- to 8- fold increased risk for venous thromboembolism. Individuals homozygous for this variant (ie, with a copy of the variant on each chromosome) have an approximately 80-fold increased risk for venous thromboembolism. Individuals who carry both a c.*97G>A variant in the F2 gene and Factor V Leiden have an approximately 20-fold increased risk for venous thromboembolism. Risks are likely to be even higher in more complex genotype combinations in volving the F2 c.*97G>A variant and Factor V Leiden (PMID: 12458099). Additional risk factors include but are not limited to: deficiency of protein C, protein  S, or antithrombin III, age, male sex, personal or family history of deep vein thromboembolism, smoking, surgery, prolonged immobilization, malignant neoplasm, tamoxifen treatment, raloxifene treatment, oral contraceptive use, hormone replacement therapy, and pregnancy. Management of thrombotic risk and thrombotic events should follow established guidelines and fit the clinical circumstance. This result cannot predict the occurrence or recurrence of a thrombotic event. Comment: Genetic counseling is recommended to discuss the potential clinical implications of positive results, as well as recommendations for testing family members. Genetic Coordinators are available for health care providers to discuss results at [phone]). Test Details: Variant Analyzed: c.1601G>A (p. Arg534Gln), referred to as Fact or V Leiden Methods/Limitations: DNA analysis of the F5 gene (NM_000130.5) was performed by PCR amplification followed by restriction enzyme analysis. The diagnostic sensitivity is >99%. Results must be combined with clinical information for the most accurate interpretation. Molecular- based testing is highly accurate, but as in any laboratory test, diagnostic errors may occur. False positive or false negative results may occur for reasons that include genetic variants, blood transfusions, bone marrow transplantation, somatic or tissue-specific mosaicism, mislabeled samples, or erroneous representation of family relationships. This test was developed and its performance characteristics determined by [facility]. It has not been cleared or approved by the Food and Drug Administration. References: [place] [facility], [facility]; [facility] Professional Practice and Guidelines Committee. Addendum: [place] consensus statement on fac tor V Leiden mutation testing. Genet Med. 2021 Mar 5. doi: 25.0539/J67341-937- 01108-x. PMID:  90240973. Kristopher Oppenheim. Factor V Leiden Thrombophilia. 1999 May 14 (Updated 2018 Jan 4). In: Tarri Glenn, Ardinger HH, Pagon RA, et al., editors. GeneReviews(R) (Internet). [address] ([place]): [place], [place]; 1993-2021. Available from: MortgageHole.tn Terrilee Files, Carla Drape, Marin Shutter CS; [facility] Laboratory Quality Assurance Committee. Venous thromboembolism laboratory testing (factor V Leiden and factor II c.*97G>A), 2018 update: a technical standard of the [place] ([facility]). Genet Med. 2018 Dec;20(12):1489-1498. doi: 53.2992/[id number]-N. Epub 2018 Oct 5. PMID: 98921194.    Reviewed By: Comment     Comment: (NOTE) Technical Component performed at [facility] Professional Component performed by: Melissa A. Martie Round, Ph.D., [facility] Director, Molecular Genetics [place] [id number] Performed At: [facility] [address] [place], [place] [id number] Katina Degree MDPhD [id number]   Homocysteine, serum     Status: Abnormal   Collection Time: 01/18/22  3:47 PM  Result Value Ref Range   Homocysteine 23.3 (H) 0.0 - 17.2 umol/L    Comment: (NOTE) Performed At: [facility] [address] [place], [place] [id number] Jolene Schimke MD [id number]   Beta-2-glycoprotein i abs, IgG/M/A     Status: None   Collection Time: 01/18/22  3:47 PM  Result Value Ref Range   Beta-2 Glyco I IgG <9 0 - 20 GPI IgG units    Comment: (NOTE) The reference interval reflects a 3SD or 99th percentile interval, which is thought to represent a potentially clinically significant result in accordance with the International Consensus Statement on the classification criteria for definitive antiphospholipid syndrome (APS). J Thromb Haem 2006;4:295-306.    Beta-2-Glycoprotein I IgM <9 0 - 32 GPI IgM units    Comment: (NOTE) The reference interval reflects a 3SD or 99th  percentile interval, which is thought to represent a potentially clinically significant result in accordance with the International Consensus Statement on the classification criteria for definitive antiphospholipid syndrome (APS). J Thromb Haem 2006;4:295-306. Performed At: [facility] [address] [place], [place] [id number] Jolene Schimke MD OI:[id number]    Beta-2-Glycoprotein I IgA <9 0 - 25 GPI IgA units    Comment: (NOTE) The reference interval reflects a 3SD or 99th percentile interval, which is thought to represent a potentially clinically significant result in accordance with the International Consensus Statement on the classification criteria for definitive antiphospholipid syndrome (APS). J Thromb Haem 2006;4:295-306.   Lupus anticoagulant panel     Status: Abnormal   Collection Time: 01/18/22  3:47 PM  Result Value Ref Range   PTT Lupus Anticoagulant 36.5 0.0 - 43.5 sec   DRVVT 109.6 (H) 0.0 - 47.0 sec   Lupus Anticoag Interp Comment:     Comment: (NOTE) Results are consistent with the presence of a lupus anticoagulant. As only persistent lupus anticoagulant (LA) positivity meets laboratory diagnostic criteria for antiphospholipid syndrome, repeat testing in 12 or more weeks is recommended, ideally in the absence of anticoagulant therapy. Important Note: The results of LA testing are not valid for patients receiving heparin, direct Xa inhibitor (e.g., rivaroxaban, apixaban) or direct thrombin inhibitor (e.g., dabigatran) therapy. These drugs may cause false positive LA results but will not interfere with anticardiolipin and beta-2 glycoprotein 1 antibody testing. Performed At: [facility] [address] [place], [place] [id number] Jolene Schimke MD [id number]   Protein S, total     Status: Abnormal   Collection Time: 01/18/22  3:47 PM  Result Value Ref Range   Protein S Ag, Total 205 (H) 60 - 150 %    Comment: (NOTE) This test was  developed and its performance characteristics determined by [facility]. It has not been cleared or approved by the Food and Drug Administration. Total Protein S Antigen is an acute phase reactant protein and can be elevated in inflammatory states. Performed At: [facility] [address] [place], [place] [id number] Jolene Schimke MD [id number]   Protein S activity     Status: None   Collection Time: 01/18/22  3:47 PM  Result Value Ref Range   Protein S Activity 108 63 - 140 %    Comment: (NOTE) Protein S activity may be falsely increased (masking an abnormal, low result) in patients receiving direct Xa inhibitor (e.g., rivaroxaban, apixaban, edoxaban) or a direct thrombin inhibitor (e.g., dabigatran) anticoagulant treatment due to assay interference by these drugs. Performed At: [facility] [address] [place], [place] [id number] Jolene Schimke MD [id number]  Protein C, total     Status: None   Collection Time: 01/18/22  3:47 PM  Result Value Ref Range   Protein C, Total 101 60 - 150 %    Comment: (NOTE) Performed At: [facility] [place], [place] [id number] Rush Farmer MD [id number]   Protein C activity     Status: None   Collection Time: 01/18/22  3:47 PM  Result Value Ref Range   Protein C Activity 138 73 - 180 %    Comment: (NOTE) Performed At: [facility] [place], [place] [id number] Rush Farmer MD [id number]   Antithrombin III     Status: None   Collection Time: 01/18/22  3:47 PM  Result Value Ref Range   AntiThromb III Func 101 75 - 120 %    Comment: Performed at [facility], [place] [address] [place], [place] [address]  Kappa/lambda light chains     Status: Abnormal   Collection Time: 01/18/22  3:47 PM  Result Value Ref Range   Kappa free light chain 31.7 (H) 3.3 - 19.4 mg/L   Lambda free light chains 25.8 5.7 - 26.3 mg/L   Kappa, lambda light chain ratio 1.23 0.26 - 1.65     Comment: (NOTE) Performed At: [facility] [address] [place], [place] [id number] Rush Farmer MD [id number]   PSA, total and free     Status: None   Collection Time: 01/18/22  3:47 PM  Result Value Ref Range   PSA, Free 0.12 N/A ng/mL    Comment: Roche ECLIA methodology.   PSA, Free Pct 12.0 %    Comment: (NOTE) The table below lists the probability of prostate cancer for men with non-suspicious DRE results and total PSA between 4 and 10 ng/mL, by patient age Ricci Barker, [place], [id number]).                  % Free PSA       50-64 yr        65-75 yr                  0.00-10.00%        56%             55%                 10.01-15.00%        24%             35%                 15.01-20.00%        17%             23%                 20.01-25.00%        10%             20%                      >25.00%         5%              9% Please note:  Catalona et al did not make specific              recommendations regarding the use of              percent free PSA for any other population  of men. Performed At: [facility] [place], [place] [id number] Rush Farmer MD [id number]    Prostate Specific Ag, Serum 1.0 0.0 - 4.0 ng/mL    Comment: (NOTE) Roche ECLIA methodology. According to the American Urological Association, Serum PSA should decrease and remain at undetectable levels after radical prostatectomy. The AUA defines biochemical recurrence as an initial PSA value 0.2 ng/mL or greater followed by a subsequent confirmatory PSA value 0.2 ng/mL or greater. Values obtained with different assay methods or kits cannot be used interchangeably. Results cannot be interpreted as absolute evidence of the presence or absence of malignant disease.   CMP ([place] only)     Status: Abnormal   Collection Time: 01/18/22  3:47 PM  Result Value Ref Range   Sodium 137 135 - 145 mmol/L   Potassium 4.0 3.5 - 5.1 mmol/L    Chloride 103 98 - 111 mmol/L   CO2 27 22 - 32 mmol/L   Glucose, Bld 128 (H) 70 - 99 mg/dL    Comment: Glucose reference range applies only to samples taken after fasting for at least 8 hours.   BUN 18 8 - 23 mg/dL   Creatinine 1.58 (H) 0.61 - 1.24 mg/dL   Calcium 9.8 8.9 - 10.3 mg/dL   Total Protein 7.4 6.5 - 8.1 g/dL   Albumin 4.4 3.5 - 5.0 g/dL   AST 39 15 - 41 U/L   ALT 58 (H) 0 - 44 U/L   Alkaline Phosphatase 77 38 - 126 U/L   Total Bilirubin 0.6 0.3 - 1.2 mg/dL   GFR, Estimated 49 (L) >60 mL/min    Comment: (NOTE) Calculated using the CKD-EPI Creatinine Equation (2021)    Anion gap 7 5 - 15    Comment: Performed at [facility], [place] [address], [place], [place] [address]  CBC with Differential ([place] Only)     Status: Abnormal   Collection Time: 01/18/22  3:47 PM  Result Value Ref Range   WBC Count 7.9 4.0 - 10.5 K/uL   RBC 4.52 4.22 - 5.81 MIL/uL   Hemoglobin 15.2 13.0 - 17.0 g/dL   HCT 44.1 39.0 - 52.0 %   MCV 97.6 80.0 - 100.0 fL   MCH 33.6 26.0 - 34.0 pg   MCHC 34.5 30.0 - 36.0 g/dL   RDW 12.2 11.5 - 15.5 %   Platelet Count 294 150 - 400 K/uL   nRBC 0.0 0.0 - 0.2 %   Neutrophils Relative % 69 %   Neutro Abs 5.4 1.7 - 7.7 K/uL   Lymphocytes Relative 20 %   Lymphs Abs 1.6 0.7 - 4.0 K/uL   Monocytes Relative 8 %   Monocytes Absolute 0.7 0.1 - 1.0 K/uL   Eosinophils Relative 1 %   Eosinophils Absolute 0.1 0.0 - 0.5 K/uL   Basophils Relative 1 %   Basophils Absolute 0.1 0.0 - 0.1 K/uL   Immature Granulocytes 1 %   Abs Immature Granulocytes 0.11 (H) 0.00 - 0.07 K/uL    Comment: Performed at [facility], [place] [address] [place], [place] [address]  dRVVT Mix     Status: Abnormal   Collection Time: 01/18/22  3:47 PM  Result Value Ref Range   dRVVT Mix 62.2 (H) 0.0 - 40.4 sec    Comment: (NOTE) Performed At: [facility] [place], [place] [id number] Rush Farmer MD [id number]    dRVVT Confirm     Status: Abnormal  Collection Time: 01/18/22  3:47 PM  Result Value Ref Range   dRVVT Confirm 1.5 (H) 0.8 - 1.2 ratio    Comment: (NOTE) Performed At: [facility] [place], [place] [id number] Rush Farmer MD [id number]   Multiple Myeloma Panel (SPEP&IFE w/QIG)     Status: Abnormal   Collection Time: 01/18/22  3:48 PM  Result Value Ref Range   IgG (Immunoglobin G), Serum 1,079 603 - 1,613 mg/dL   IgA 327 61 - 437 mg/dL   IgM (Immunoglobulin M), Srm 50 20 - 172 mg/dL   Total Protein ELP 7.2 6.0 - 8.5 g/dL   Albumin SerPl Elph-Mcnc 3.8 2.9 - 4.4 g/dL   Alpha 1 0.2 0.0 - 0.4 g/dL   Alpha2 Glob SerPl Elph-Mcnc 0.8 0.4 - 1.0 g/dL   B-Globulin SerPl Elph-Mcnc 1.4 (H) 0.7 - 1.3 g/dL   Gamma Glob SerPl Elph-Mcnc 0.9 0.4 - 1.8 g/dL   M Protein SerPl Elph-Mcnc Not Observed Not Observed g/dL   Globulin, Total 3.4 2.2 - 3.9 g/dL   Albumin/Glob SerPl 1.2 0.7 - 1.7   IFE 1 Comment     Comment: (NOTE) The immunofixation pattern appears unremarkable. Evidence of monoclonal protein is not apparent.    Please Note Comment     Comment: (NOTE) Protein electrophoresis scan will follow via computer, mail, or courier delivery. Performed At: [facility] [place], [place] [id number] Rush Farmer MD [id number]      RADIOGRAPHIC STUDIES: I have personally reviewed the radiological images as listed and agreed with the findings in the report. No results found.  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 MM panel shows no observable M protein spike. dRVVT confirm is slightly elevated at 1.5 and mix is 62.2 sec CBC unremarkable. CMP stable. PSA WNL. [facility] labs show slightly elevated KFLC at 31.7 mg/L. Antithrombin III WNL. Protein C labs WNL. Protein S  labs show total of 205%. Lupus anticoagulant present. B2G IgG is WNL. Homocysteine is slightly elevated at 23.3. Factor 5 Leiden not detected. Prothrombin gene mutation not detected. Cardiolipin antibodies WNL. JAK2 shows normal result with no mutation detected. -Patient extensive medical records were reviewed with him in detail and confirmed. -He currently notes no acute shortness of breath lower extremity pain or swelling. -Recommended using compression socks. Discussed interventions to try to reduce the risk of subsequent blood clots including staying well-hydrated and using compression socks avoid sitting for long periods of time on the ground with cross legs. -We discussed that given his event is unprovoked irrespective of work-up for hypercoagulable states he would need to be on long-term anticoagulation due to high risk of recurrent clotting without this. -He will continue to be on Eliquis 5 mg p.o. twice daily -Patient was recommended to follow-up with his primary care physician for age-appropriate cancer screening.  His CT chest abdomen pelvis has not shown any overt evidence of malignancy..  Follow up: RTC with Dr Irene Limbo with labs in 6 months   No orders of the defined types were placed in this encounter.  All of the patients questions were answered with apparent satisfaction. The patient knows to call the clinic with any problems, questions or concerns.  I spent 60 minutes counseling the patient face to face. The total time spent in the appointment was 15 minutes and more than 50% was on  counseling and direct patient cares.    Sullivan Lone MD MS AAHIVMS [facility] Hematology/Oncology Physician [facility]  (Office):       [phone] (Work cell):  [phone] (Fax):           [phone]  I, Melene Muller, am acting as scribe for Dr. Sullivan Lone, MD.  .I have reviewed the above documentation for accuracy and completeness, and I agree with the above. Brunetta Genera MD

## 2022-02-27 ENCOUNTER — Other Ambulatory Visit: Payer: Self-pay | Admitting: Gastroenterology

## 2022-05-25 ENCOUNTER — Other Ambulatory Visit: Payer: Self-pay | Admitting: Hematology

## 2022-05-25 DIAGNOSIS — I2692 Saddle embolus of pulmonary artery without acute cor pulmonale: Secondary | ICD-10-CM

## 2022-08-03 ENCOUNTER — Other Ambulatory Visit: Payer: Self-pay | Admitting: Hematology

## 2022-08-03 DIAGNOSIS — D6859 Other primary thrombophilia: Secondary | ICD-10-CM

## 2022-08-03 DIAGNOSIS — I82413 Acute embolism and thrombosis of femoral vein, bilateral: Secondary | ICD-10-CM

## 2022-08-04 ENCOUNTER — Inpatient Hospital Stay: Payer: Self-pay | Admitting: Hematology

## 2022-08-04 ENCOUNTER — Inpatient Hospital Stay: Payer: Self-pay | Attending: *Deleted

## 2022-09-30 ENCOUNTER — Encounter: Payer: Self-pay | Admitting: Podiatry

## 2022-09-30 ENCOUNTER — Ambulatory Visit (INDEPENDENT_AMBULATORY_CARE_PROVIDER_SITE_OTHER): Payer: Medicaid Other | Admitting: Podiatry

## 2022-09-30 DIAGNOSIS — J40 Bronchitis, not specified as acute or chronic: Secondary | ICD-10-CM | POA: Insufficient documentation

## 2022-09-30 DIAGNOSIS — J309 Allergic rhinitis, unspecified: Secondary | ICD-10-CM | POA: Insufficient documentation

## 2022-09-30 DIAGNOSIS — G609 Hereditary and idiopathic neuropathy, unspecified: Secondary | ICD-10-CM | POA: Diagnosis not present

## 2022-09-30 DIAGNOSIS — Z87891 Personal history of nicotine dependence: Secondary | ICD-10-CM | POA: Insufficient documentation

## 2022-09-30 DIAGNOSIS — J0140 Acute pansinusitis, unspecified: Secondary | ICD-10-CM | POA: Insufficient documentation

## 2022-09-30 MED ORDER — GABAPENTIN 300 MG PO CAPS: ORAL_CAPSULE | ORAL | 0 refills | Status: DC

## 2022-09-30 NOTE — Progress Notes (Signed)
  Subjective:  Patient ID: Justin English, male    DOB: December 11, 1955,  MRN: [id number]  Chief Complaint  Patient presents with   Peripheral Neuropathy    NP- neuropathy - patient takes Eliquis -numbness in toes, burning and stinging in the bottoms of his feet    67 y.o. male presents with the above complaint. History confirmed with patient.  This is been going on for some time, gradually worsening.  Has not been on medical therapy for it.  He is not diabetic, does not know if he has any vitamin deficiencies, denies any history of HIV syphilis or other infections.  No family history  Objective:  Physical Exam: warm, good capillary refill, no trophic changes or ulcerative lesions, normal DP and PT pulses, normal monofilament exam, and some loss of sensation to light touch, pressure sensation intact  Assessment:   1. Idiopathic polyneuropathy      Plan:  Patient was evaluated and treated and all questions answered.  His symptoms clearly are consistent with a polyneuropathy.  It affects both feet equally and symmetrically.  We discussed the possible etiologies of these including Idiopathic neuropathy.  He has no family history or known risk factors for this.  I did recommend checking some lab work and a comprehensive metabolic panel, B12 and folate were ordered.  We also discussed medical treatment of this with medications, gabapentin Rx sent to pharmacy we discussed its use increasing titration over the next 2 months and possible side effects.  Return for follow-up as needed. No follow-ups on file.

## 2022-10-01 LAB — COMPREHENSIVE METABOLIC PANEL
ALT: 43 IU/L (ref 0–44)
AST: 37 IU/L (ref 0–40)
Albumin/Globulin Ratio: 1.5 (ref 1.2–2.2)
Albumin: 4.3 g/dL (ref 3.9–4.9)
Alkaline Phosphatase: 98 IU/L (ref 44–121)
BUN/Creatinine Ratio: 9 — ABNORMAL LOW (ref 10–24)
BUN: 23 mg/dL (ref 8–27)
Bilirubin Total: 0.8 mg/dL (ref 0.0–1.2)
CO2: 24 mmol/L (ref 20–29)
Calcium: 9.5 mg/dL (ref 8.6–10.2)
Chloride: 96 mmol/L (ref 96–106)
Creatinine, Ser: 2.69 mg/dL — ABNORMAL HIGH (ref 0.76–1.27)
Globulin, Total: 2.9 g/dL (ref 1.5–4.5)
Glucose: 121 mg/dL — ABNORMAL HIGH (ref 70–99)
Potassium: 3.9 mmol/L (ref 3.5–5.2)
Sodium: 138 mmol/L (ref 134–144)
Total Protein: 7.2 g/dL (ref 6.0–8.5)
eGFR: 26 mL/min/{1.73_m2} — ABNORMAL LOW (ref >59)

## 2022-10-01 LAB — FOLATE: Folate: 2.6 ng/mL — ABNORMAL LOW (ref >3.0)

## 2022-10-01 LAB — VITAMIN B12: Vitamin B-12: 374 pg/mL (ref 232–1245)

## 2022-10-26 ENCOUNTER — Other Ambulatory Visit: Payer: Self-pay | Admitting: Hematology

## 2022-10-26 DIAGNOSIS — I2692 Saddle embolus of pulmonary artery without acute cor pulmonale: Secondary | ICD-10-CM

## 2022-12-25 ENCOUNTER — Other Ambulatory Visit: Payer: Self-pay | Admitting: Podiatry

## 2023-02-25 ENCOUNTER — Other Ambulatory Visit: Payer: Self-pay | Admitting: Hematology

## 2023-02-25 DIAGNOSIS — I2692 Saddle embolus of pulmonary artery without acute cor pulmonale: Secondary | ICD-10-CM

## 2023-02-25 NOTE — Progress Notes (Signed)
Attempted to contact pt regarding eliquis refill. Left pt message that he needs to schedule an appointment. Pt has not been seen for 1 year. 1 refill sent in but will not continue if pt does not come in to be seen.

## 2023-03-10 ENCOUNTER — Other Ambulatory Visit: Payer: Self-pay

## 2023-03-10 DIAGNOSIS — I2692 Saddle embolus of pulmonary artery without acute cor pulmonale: Secondary | ICD-10-CM

## 2023-03-10 MED ORDER — APIXABAN 5 MG PO TABS: 5.0000 mg | ORAL_TABLET | Freq: Two times a day (BID) | ORAL | 0 refills | Status: AC

## 2023-03-17 ENCOUNTER — Ambulatory Visit: Payer: Medicaid Other | Admitting: Podiatry

## 2023-03-17 ENCOUNTER — Encounter: Payer: Self-pay | Admitting: Podiatry

## 2023-03-17 VITALS — Ht 74.0 in | Wt 246.9 lb

## 2023-03-17 DIAGNOSIS — G609 Hereditary and idiopathic neuropathy, unspecified: Secondary | ICD-10-CM | POA: Diagnosis not present

## 2023-03-17 MED ORDER — GABAPENTIN 300 MG PO CAPS: ORAL_CAPSULE | ORAL | 0 refills | Status: DC

## 2023-03-17 NOTE — Progress Notes (Signed)
Subjective:  Patient ID: Justin English, male    DOB: [birth year],  MRN: [id number]  No chief complaint on file.   Discussed the use of AI scribe software for clinical note transcription with the patient, who gave verbal consent to proceed.  History of Present Illness   The patient, with a history of peripheral neuropathy, presents with persistent burning and steaming sensation in his feet. Despite being on gabapentin twice daily, he reports no improvement in his symptoms. He denies any side effects from the medication. He has not noticed any significant changes in his condition since starting the medication. He denies smoking. He also reports a low folate level and a low-normal B12 level from lab work done in May. He has not seen his primary care doctor recently due to frequent changes in his provider. He expresses interest in alternative treatments he has seen on social media, including shock therapy and vibration pads. He also mentions a lotion available at [facility] and a vitamin supplement called Nervive.          Objective:    Physical Exam   VITALS: Pulse described as 'feels good'. EXTREMITIES: warm, good capillary refill, no trophic changes or ulcerative lesions, normal DP and PT pulses, normal monofilament exam, and some loss of sensation to light touch, pressure sensation intact       No images are attached to the encounter.    Results   LABS Folate: low (09/2022) B12: low-normal range (09/2022)      Assessment:   1. Idiopathic polyneuropathy      Plan:  Patient was evaluated and treated and all questions answered.  Assessment and Plan    Peripheral Neuropathy Despite gabapentin therapy, he continues to experience a persistent burning sensation in his feet without any reported side effects from the medication. We discussed increasing the gabapentin dose to 600mg  at night, 300mg  in the morning, and 300mg  in the afternoon, while also considering the potential for  side effects such as sedation and grogginess. We will reevaluate in 5-6 weeks, at the end of November, to assess his response and consider further dose adjustment.  Vitamin Deficiency Low folate and borderline low B12 levels were noted on labs from May, indicating a potential impact on nerve function and the need for supplementation. We will refer him to primary care for supplementation of folate and B12, where his levels will be rechecked, and appropriate dosing determined.  Patient Interest in Alternative Therapies He expressed interest in vibration pads and topical lotions for neuropathy relief. We discussed the lack of strong evidence but acknowledged the potential for symptomatic relief. He is encouraged to try these if desired, with attention to cost considerations, and we suggested over-the-counter Nervive as a potential supplement to try.          No follow-ups on file.

## 2023-03-17 NOTE — Patient Instructions (Signed)
VISIT SUMMARY:  During your visit, we discussed your ongoing issues with peripheral neuropathy, which is a condition that causes a burning sensation in your feet. Despite taking gabapentin, you have not noticed any improvement. We also discussed your low folate and borderline low B12 levels, which could be affecting your nerve function. You expressed interest in trying alternative therapies for your condition.  YOUR PLAN:  -PERIPHERAL NEUROPATHY: We plan to increase your gabapentin dose to help manage the burning sensation in your feet. We will check in with you in about 5-6 weeks to see how you're doing and adjust the dose if necessary.  -VITAMIN DEFICIENCY: Your low folate and borderline low B12 levels could be affecting your nerve function. We will refer you to your primary care doctor to start supplementation and monitor your levels.  -ALTERNATIVE THERAPIES: While there isn't strong evidence supporting the use of vibration pads and topical lotions for neuropathy relief, you're welcome to try these if you'd like. We also suggest trying an over-the-counter supplement called Nervive.  INSTRUCTIONS:  Please start taking the increased dose of gabapentin as discussed. Make an appointment with your primary care doctor to start vitamin supplementation. If you decide to try any alternative therapies, please keep [place] informed about any changes in your symptoms.

## 2023-05-02 ENCOUNTER — Telehealth: Payer: Self-pay

## 2023-05-02 NOTE — Telephone Encounter (Signed)
Patient called this morning and I spoke with him at length. He is calling to report that he is still having significant burning and stinging in his feet after increasing the gabapentin from 2 bid to 4 bid. He reports slight improvement, but overall he is still about the same.

## 2023-05-03 NOTE — Telephone Encounter (Signed)
I called to discuss options about switching to Lyrica, no answer and left VM but will need to discuss w/ him prior to prescribing. If he calls back would recommend he have a visit scheduled

## 2023-05-09 ENCOUNTER — Encounter: Payer: Self-pay | Admitting: Gastroenterology

## 2023-06-16 ENCOUNTER — Other Ambulatory Visit: Payer: Self-pay | Admitting: Podiatry

## 2023-09-14 ENCOUNTER — Other Ambulatory Visit: Payer: Self-pay | Admitting: Podiatry

## 2023-12-01 ENCOUNTER — Ambulatory Visit: Admitting: Podiatry

## 2023-12-01 ENCOUNTER — Telehealth: Payer: Self-pay | Admitting: Podiatry

## 2023-12-01 ENCOUNTER — Encounter: Payer: Self-pay | Admitting: Podiatry

## 2023-12-01 DIAGNOSIS — G609 Hereditary and idiopathic neuropathy, unspecified: Secondary | ICD-10-CM | POA: Diagnosis not present

## 2023-12-01 MED ORDER — PREGABALIN 50 MG PO CAPS: 50.0000 mg | ORAL_CAPSULE | Freq: Three times a day (TID) | ORAL | 2 refills | Status: DC

## 2023-12-01 NOTE — Telephone Encounter (Signed)
 Pharmacist Stew called stating he received RX for generic Lyrica on pt. Patient has rx for Gabapentin , Stew the pharmacists wants to confirm did Dr Silva want to discontinue one of these meds.

## 2023-12-01 NOTE — Telephone Encounter (Signed)
 Per Dr Silva pt stopping Gabapentin  cancel refills called [facility] Pharmacist relayed message from Dr Silva.

## 2023-12-01 NOTE — Progress Notes (Signed)
  Subjective:  Patient ID: Justin English, male    DOB: 19-May-1956,  MRN: [id number]  Chief Complaint  Patient presents with   Peripheral Neuropathy    Rm22 Neuropathy pain bilateral/ gabapentin  is not working for him/diabetic taking metformin and mounjaro    Discussed the use of AI scribe software for clinical note transcription with the patient, who gave verbal consent to proceed.  History of Present Illness Justin English is a 68 year old male with peripheral neuropathy who presents for follow-up due to ineffective treatment with gabapentin .  He experiences persistent burning and stinging pain in both feet, described as occurring 'from sun up to sun down'. There is numbness under the balls of his feet and a sensation as if his toes 'want to break off'. Occasionally, he notices swelling in one of his feet.  He has been taking gabapentin  at a total dose of 1200 mg, but there is no significant relief from his symptoms. He denies experiencing any side effects such as sleepiness, although he mentions not sleeping well in general.  No history of depression or suicidal thoughts. No new symptoms apart from occasional swelling in one foot.      Objective:    Physical Exam VASCULAR: DP and PT pulse palpable.  Foot is warm and well-perfused.  Capillary fill time is brisk DERMATOLOGIC: Normal skin turgor texture and temperature.  No open lesions or rashes or ulcerations NEUROLOGIC: Abnormal sensation with decreased peripheral sensation and paresthesias ORTHOPEDIC: Smooth pain-free range of motion of all examined joints.  No ecchymosis or bruising.  No gross deformity.  No pain to palpation.   No images are attached to the encounter.    Results     Assessment:   1. Idiopathic polyneuropathy      Plan:  Patient was evaluated and treated and all questions answered.  Assessment and Plan Assessment & Plan Idiopathic peripheral neuropathy Chronic peripheral neuropathy with  burning, tingling pain, and numbness in both feet. Gabapentin  at 1200 mg has been ineffective. Symptoms include burning and stinging from morning to night, numbness under the balls of the feet, and a sensation of toes wanting to break off. Occasional swelling present. No new symptoms or side effects from gabapentin . No depression or suicidal thoughts, relevant for Lyrica use. - Discontinue gabapentin  without tapering. - Initiate pregabalin (Lyrica) at 50 mg three times a day. - Educate on potential side effects of pregabalin, including dizziness, drowsiness, and sleepiness. - Schedule follow-up visit in October to assess effectiveness of pregabalin. - If pregabalin is ineffective, consider Qutenza treatment, a capsaicin patch applied in-office.  Coverage may be difficult if not diabetic      Return in about 3 months (around 03/02/2024) for f/u lyrica treatment.

## 2024-03-01 ENCOUNTER — Ambulatory Visit: Admitting: Podiatry

## 2024-03-01 ENCOUNTER — Other Ambulatory Visit: Payer: Self-pay | Admitting: Podiatry

## 2024-03-01 VITALS — Ht 74.0 in | Wt 246.9 lb

## 2024-03-01 DIAGNOSIS — G609 Hereditary and idiopathic neuropathy, unspecified: Secondary | ICD-10-CM | POA: Diagnosis not present

## 2024-03-01 NOTE — Progress Notes (Signed)
  Subjective:  Patient ID: Justin English, male    DOB: 17-Oct-1955,  MRN: [id number]  Chief Complaint  Patient presents with   polyneuropathy    RM 21 Return in about 3 months (around 03/02/2024) for f/u lyrica  treatment. Pt states no relief from symptoms( tingling/burning/sensation)  w/ Lyrica  (just completed treatment on 02/29/24).     Discussed the use of AI scribe software for clinical note transcription with the patient, who gave verbal consent to proceed.  History of Present Illness Justin English is a 68 year old male with peripheral neuropathy who presents for follow-up due to ineffective treatment with gabapentin .  He thinks maybe a little bit better with the Lyrica  than the gabapentin  but overall still very limiting for him and has not improved much reports no adverse side effects from taking the Lyrica       Objective:    Physical Exam VASCULAR: DP and PT pulse palpable.  Foot is warm and well-perfused.  Capillary fill time is brisk DERMATOLOGIC: Normal skin turgor texture and temperature.  No open lesions or rashes or ulcerations NEUROLOGIC: Abnormal sensation with decreased peripheral sensation and paresthesias ORTHOPEDIC: Smooth pain-free range of motion of all examined joints.  No ecchymosis or bruising.  No gross deformity.  No pain to palpation.   No images are attached to the encounter.    Results     Assessment:   1. Idiopathic polyneuropathy      Plan:  Patient was evaluated and treated and all questions answered.  Assessment and Plan Assessment & Plan Idiopathic peripheral neuropathy Still not much change or improvement.  He will stay at his current Lyrica  dosing as I do think it is preventative from getting worse but so far still minimal overall relief.  I recommended consultation with interventional pain specialist for further management medically or with intervention such as spinal cord stimulator but not sure if he will be a [facility] candidate as  he is not diabetic.  Referral placed for this he will follow-up with me as needed      Return if symptoms worsen or fail to improve.

## 2024-04-09 ENCOUNTER — Encounter: Payer: Self-pay | Admitting: Physical Medicine and Rehabilitation

## 2024-04-24 ENCOUNTER — Other Ambulatory Visit: Payer: Self-pay | Admitting: Nephrology

## 2024-04-24 DIAGNOSIS — N184 Chronic kidney disease, stage 4 (severe): Secondary | ICD-10-CM

## 2024-06-04 ENCOUNTER — Other Ambulatory Visit: Payer: Self-pay | Admitting: Podiatry

## 2024-06-14 ENCOUNTER — Encounter: Payer: Self-pay | Admitting: Physical Medicine and Rehabilitation

## 2024-07-02 ENCOUNTER — Encounter: Payer: Self-pay | Admitting: Physical Medicine and Rehabilitation

## 2024-09-10 ENCOUNTER — Encounter: Payer: Self-pay | Admitting: Physical Medicine and Rehabilitation
